# Patient Record
Sex: Female | Born: 1960
Health system: Southern US, Community
[De-identification: ages and names within clinical notes are randomized; demographics above are authoritative.]

## PROBLEM LIST (undated history)

## (undated) DIAGNOSIS — F329 Major depressive disorder, single episode, unspecified: Secondary | ICD-10-CM

## (undated) DIAGNOSIS — T4145XA Adverse effect of unspecified anesthetic, initial encounter: Secondary | ICD-10-CM

## (undated) DIAGNOSIS — K635 Polyp of colon: Secondary | ICD-10-CM

## (undated) DIAGNOSIS — M792 Neuralgia and neuritis, unspecified: Secondary | ICD-10-CM

## (undated) DIAGNOSIS — C801 Malignant (primary) neoplasm, unspecified: Secondary | ICD-10-CM

## (undated) DIAGNOSIS — R413 Other amnesia: Secondary | ICD-10-CM

## (undated) DIAGNOSIS — G47419 Narcolepsy without cataplexy: Secondary | ICD-10-CM

## (undated) DIAGNOSIS — E785 Hyperlipidemia, unspecified: Secondary | ICD-10-CM

## (undated) DIAGNOSIS — M25519 Pain in unspecified shoulder: Secondary | ICD-10-CM

## (undated) DIAGNOSIS — R0600 Dyspnea, unspecified: Secondary | ICD-10-CM

## (undated) DIAGNOSIS — G3184 Mild cognitive impairment, so stated: Principal | ICD-10-CM

## (undated) DIAGNOSIS — T8859XA Other complications of anesthesia, initial encounter: Secondary | ICD-10-CM

## (undated) DIAGNOSIS — G92 Toxic encephalopathy: Secondary | ICD-10-CM

## (undated) DIAGNOSIS — M1990 Unspecified osteoarthritis, unspecified site: Secondary | ICD-10-CM

## (undated) DIAGNOSIS — F411 Generalized anxiety disorder: Secondary | ICD-10-CM

## (undated) DIAGNOSIS — G929 Unspecified toxic encephalopathy: Secondary | ICD-10-CM

## (undated) DIAGNOSIS — K859 Acute pancreatitis without necrosis or infection, unspecified: Secondary | ICD-10-CM

## (undated) DIAGNOSIS — Q453 Other congenital malformations of pancreas and pancreatic duct: Secondary | ICD-10-CM

## (undated) DIAGNOSIS — G8929 Other chronic pain: Secondary | ICD-10-CM

## (undated) DIAGNOSIS — K219 Gastro-esophageal reflux disease without esophagitis: Secondary | ICD-10-CM

## (undated) HISTORY — DX: Malignant (primary) neoplasm, unspecified: C80.1

## (undated) HISTORY — DX: Hyperlipidemia, unspecified: E78.5

## (undated) HISTORY — PX: LUNG CANCER SURGERY: SHX702

## (undated) HISTORY — PX: UPPER ESOPHAGEAL ENDOSCOPIC ULTRASOUND (EUS): SHX6562

## (undated) HISTORY — PX: LUNG REMOVAL, PARTIAL: SHX233

## (undated) HISTORY — PX: COLONOSCOPY: SHX174

## (undated) HISTORY — PX: CHOLECYSTECTOMY: SHX55

## (undated) HISTORY — DX: Mild cognitive impairment, so stated: G31.84

## (undated) HISTORY — PX: TUBAL LIGATION: SHX77

## (undated) HISTORY — DX: Major depressive disorder, single episode, unspecified: F32.9

## (undated) HISTORY — PX: TONSILLECTOMY: SUR1361

## (undated) HISTORY — PX: OTHER SURGICAL HISTORY: SHX169

## (undated) HISTORY — DX: Other chronic pain: G89.29

## (undated) HISTORY — DX: Pain in unspecified shoulder: M25.519

## (undated) HISTORY — DX: Generalized anxiety disorder: F41.1

---

## 2010-06-17 ENCOUNTER — Other Ambulatory Visit: Payer: Self-pay

## 2010-06-17 DIAGNOSIS — C3490 Malignant neoplasm of unspecified part of unspecified bronchus or lung: Secondary | ICD-10-CM

## 2010-07-30 ENCOUNTER — Ambulatory Visit
Admission: RE | Admit: 2010-07-30 | Discharge: 2010-07-30 | Disposition: A | Discharge status: Home / Self Care | Payer: BC Managed Care – PPO | Source: Ambulatory Visit

## 2010-07-30 ENCOUNTER — Other Ambulatory Visit: Payer: Self-pay | Admitting: Medical Oncology

## 2010-07-30 DIAGNOSIS — C3490 Malignant neoplasm of unspecified part of unspecified bronchus or lung: Secondary | ICD-10-CM

## 2010-07-30 MED ORDER — IOHEXOL 300 MG/ML  SOLN
75.0000 mL | Freq: Once | INTRAMUSCULAR | Status: AC | PRN
  Administered 2010-07-30: 75 mL via INTRAVENOUS

## 2011-01-27 ENCOUNTER — Other Ambulatory Visit: Payer: Self-pay | Admitting: Medical Oncology

## 2011-01-27 DIAGNOSIS — C3410 Malignant neoplasm of upper lobe, unspecified bronchus or lung: Secondary | ICD-10-CM

## 2011-02-03 ENCOUNTER — Ambulatory Visit
Admission: RE | Admit: 2011-02-03 | Discharge: 2011-02-03 | Disposition: A | Discharge status: Home / Self Care | Payer: BC Managed Care – PPO | Source: Ambulatory Visit | Attending: Medical Oncology | Admitting: Medical Oncology

## 2011-02-03 DIAGNOSIS — C3410 Malignant neoplasm of upper lobe, unspecified bronchus or lung: Secondary | ICD-10-CM

## 2011-02-03 MED ORDER — IOHEXOL 300 MG/ML  SOLN
75.0000 mL | Freq: Once | INTRAMUSCULAR | Status: AC | PRN
  Administered 2011-02-03: 75 mL via INTRAVENOUS

## 2011-07-29 ENCOUNTER — Other Ambulatory Visit: Payer: Self-pay | Admitting: Medical Oncology

## 2011-07-29 DIAGNOSIS — C3490 Malignant neoplasm of unspecified part of unspecified bronchus or lung: Secondary | ICD-10-CM

## 2011-08-03 ENCOUNTER — Ambulatory Visit
Admission: RE | Admit: 2011-08-03 | Discharge: 2011-08-03 | Disposition: A | Discharge status: Home / Self Care | Payer: BC Managed Care – PPO | Source: Ambulatory Visit | Attending: Medical Oncology | Admitting: Medical Oncology

## 2011-08-03 DIAGNOSIS — C3490 Malignant neoplasm of unspecified part of unspecified bronchus or lung: Secondary | ICD-10-CM

## 2011-08-03 MED ORDER — IOHEXOL 300 MG/ML  SOLN
75.0000 mL | Freq: Once | INTRAMUSCULAR | Status: AC | PRN
  Administered 2011-08-03: 75 mL via INTRAVENOUS

## 2012-07-26 ENCOUNTER — Other Ambulatory Visit: Payer: Self-pay | Admitting: Medical Oncology

## 2012-07-26 DIAGNOSIS — Z08 Encounter for follow-up examination after completed treatment for malignant neoplasm: Secondary | ICD-10-CM

## 2012-08-04 ENCOUNTER — Ambulatory Visit (INDEPENDENT_AMBULATORY_CARE_PROVIDER_SITE_OTHER): Payer: BC Managed Care – PPO

## 2012-08-04 DIAGNOSIS — C3490 Malignant neoplasm of unspecified part of unspecified bronchus or lung: Secondary | ICD-10-CM

## 2012-08-04 DIAGNOSIS — Z08 Encounter for follow-up examination after completed treatment for malignant neoplasm: Secondary | ICD-10-CM

## 2012-08-04 DIAGNOSIS — Z09 Encounter for follow-up examination after completed treatment for conditions other than malignant neoplasm: Secondary | ICD-10-CM

## 2012-08-04 DIAGNOSIS — J438 Other emphysema: Secondary | ICD-10-CM

## 2012-08-04 MED ORDER — IOHEXOL 300 MG/ML  SOLN
75.0000 mL | Freq: Once | INTRAMUSCULAR | Status: AC | PRN
  Administered 2012-08-04: 75 mL via INTRAVENOUS

## 2013-02-12 DIAGNOSIS — C3410 Malignant neoplasm of upper lobe, unspecified bronchus or lung: Secondary | ICD-10-CM | POA: Insufficient documentation

## 2013-07-25 ENCOUNTER — Encounter: Payer: Self-pay | Admitting: Family Medicine

## 2013-07-25 ENCOUNTER — Ambulatory Visit (INDEPENDENT_AMBULATORY_CARE_PROVIDER_SITE_OTHER): Payer: BC Managed Care – PPO | Admitting: Family Medicine

## 2013-07-25 VITALS — BP 141/94 | HR 113 | Ht 64.0 in | Wt 184.0 lb

## 2013-07-25 DIAGNOSIS — M25519 Pain in unspecified shoulder: Secondary | ICD-10-CM

## 2013-07-25 DIAGNOSIS — G8929 Other chronic pain: Secondary | ICD-10-CM

## 2013-07-25 DIAGNOSIS — F3289 Other specified depressive episodes: Secondary | ICD-10-CM

## 2013-07-25 DIAGNOSIS — I10 Essential (primary) hypertension: Secondary | ICD-10-CM

## 2013-07-25 DIAGNOSIS — E785 Hyperlipidemia, unspecified: Secondary | ICD-10-CM

## 2013-07-25 DIAGNOSIS — K219 Gastro-esophageal reflux disease without esophagitis: Secondary | ICD-10-CM

## 2013-07-25 DIAGNOSIS — Z79899 Other long term (current) drug therapy: Secondary | ICD-10-CM

## 2013-07-25 DIAGNOSIS — F32A Depression, unspecified: Secondary | ICD-10-CM

## 2013-07-25 DIAGNOSIS — G3184 Mild cognitive impairment, so stated: Secondary | ICD-10-CM

## 2013-07-25 DIAGNOSIS — F329 Major depressive disorder, single episode, unspecified: Secondary | ICD-10-CM

## 2013-07-25 DIAGNOSIS — Z5181 Encounter for therapeutic drug level monitoring: Secondary | ICD-10-CM

## 2013-07-25 DIAGNOSIS — F411 Generalized anxiety disorder: Secondary | ICD-10-CM

## 2013-07-25 HISTORY — DX: Hyperlipidemia, unspecified: E78.5

## 2013-07-25 HISTORY — DX: Depression, unspecified: F32.A

## 2013-07-25 HISTORY — DX: Mild cognitive impairment of uncertain or unknown etiology: G31.84

## 2013-07-25 HISTORY — DX: Other chronic pain: G89.29

## 2013-07-25 HISTORY — DX: Generalized anxiety disorder: F41.1

## 2013-07-25 MED ORDER — CLONAZEPAM 0.5 MG PO TABS: 0.5000 mg | ORAL_TABLET | Freq: Two times a day (BID) | ORAL | Status: DC | PRN

## 2013-07-25 MED ORDER — SIMVASTATIN 20 MG PO TABS: 20.0000 mg | ORAL_TABLET | Freq: Every day | ORAL | Status: DC

## 2013-07-25 NOTE — Progress Notes (Signed)
CC: Yvonne Gonzales is a 53 y.o. female is here for Establish Care   Subjective: HPI:  Very pleasant 53 year old here to establish care past medical history most significant for pink os tumor right long requiring pneumectomy.  History of mild cognitive impairment ever since chemotherapy for lung cancer currently managed by her neurologist with prescriptions of Ritalin.  History of depression ever since diagnosis of lung cancer which has improved since successful treatment and currently managed by her neurologist she states her satisfaction with treatment as high at this point taking Wellbutrin on a daily basis without known side effects  History of hyperlipidemia requesting refills on simvastatin today. On chart review at [facility] cholesterol has been extremely well-controlled on this regimen. She denies right upper quadrant pain myalgias nor exertional chest pain  History of GERD for an unknown length of time per her report it is currently well managed on Nexium twice a day. Denies any abdominal pain nor regurgitation or dysphagia  Reports history of chronic shoulder pain which has been present ever since pneumectomy which is currently being managed by pain management clinic every 2 months visit  Reports a history of general anxiety disorder which has been slightly improved with Wellbutrin but overall greatly improved with twice a day Klonopin as needed. She is requesting refills on this denies mental disturbance on her current psychiatric medication regimen  She tells me that she's been told she has elevated blood pressure at lots of her specialist visits she's unsure how high it has been.  No formal physical activity she is uncertain about sodium intake.  Review of Systems - General ROS: negative for - chills, fever, night sweats, weight gain or weight loss Ophthalmic ROS: negative for - decreased vision Psychological ROS: negative for -uncontrolled anxiety or depression ENT ROS: negative for -  hearing change, nasal congestion, tinnitus or allergies Hematological and Lymphatic ROS: negative for - bleeding problems, bruising or swollen lymph nodes Breast ROS: negative Respiratory ROS: no cough, shortness of breath, or wheezing Cardiovascular ROS: no chest pain or dyspnea on exertion Gastrointestinal ROS: no abdominal pain, change in bowel habits, or black or bloody stools Genito-Urinary ROS: negative for - genital discharge, genital ulcers, incontinence or abnormal bleeding from genitals Musculoskeletal ROS: negative for - joint pain or muscle pain Neurological ROS: negative for - headaches or memory loss Dermatological ROS: negative for lumps, mole changes, rash and skin lesion changes  Past Medical History  Diagnosis Date  . Depression 07/25/2013  . Generalized anxiety disorder 07/25/2013  . Hyperlipidemia 07/25/2013  . Chronic shoulder pain 07/25/2013    [facility] since cancer treatment.   . Mild cognitive impairment 07/25/2013    [facility] - thought to be secondary to cancer treatment     Past Surgical History  Procedure Laterality Date  . Lung removal, partial Right   . Lung cancer surgery Right     pancost tumor   No family history on file.  History   Social History  . Marital Status: Married    Spouse Name: N/A    Number of Children: N/A  . Years of Education: N/A   Occupational History  . Not on file.   Social History Main Topics  . Smoking status: Not on file  . Smokeless tobacco: Not on file  . Alcohol Use: Not on file  . Drug Use: Not on file  . Sexual Activity: Not on file   Other Topics Concern  . Not on file  Social History Narrative  . No narrative on file     Objective: BP 141/94  Pulse 113  Ht 5\' 4"  (1.626 m)  Wt 184 lb (83.462 kg)  BMI 31.57 kg/m2  General: Alert and Oriented, No Acute Distress HEENT: Pupils equal, round, reactive to light. Conjunctivae clear.  Moist mucous membranes pharynx  unremarkable Lungs: Clear to auscultation bilaterally, no wheezing/ronchi/rales.  Comfortable work of breathing. Good air movement. Cardiac: Regular rate and rhythm. Normal S1/S2.  No murmurs, rubs, nor gallops.   Abdomen: Mild obesity soft and nontender Extremities: No peripheral edema.  Strong peripheral pulses.  Mental Status: No depression, anxiety, nor agitation. Skin: Warm and dry.  Assessment & Plan: [name] was seen today for establish care.  Diagnoses and associated orders for this visit:  Mild cognitive impairment  Depression  Hyperlipidemia - Lipid Profile - COMPLETE METABOLIC PANEL WITH GFR - simvastatin (ZOCOR) 20 MG tablet; Take 1 tablet (20 mg total) by mouth daily.  GERD (gastroesophageal reflux disease)  Chronic shoulder pain  Generalized anxiety disorder - clonazePAM (KLONOPIN) 0.5 MG tablet; Take 1 tablet (0.5 mg total) by mouth 2 (two) times daily as needed for anxiety.  Encounter for monitoring statin therapy - COMPLETE METABOLIC PANEL WITH GFR  Essential hypertension, benign    Mild cognitive impairment: Currently managed by neurology Depression: Controlled continue Wellbutrin I be more than happy to manage this in the future Hyperlipidemia: Due for annual lipid panel and liver enzymes GERD: Controlled continue Nexium Chronic shoulder pain: Managed by pain management Generalized anxiety disorder: Controlled her report a prescription habits are confirmed on [place] controlled substance database, refill provided today Essential hypertension: New diagnosis, Discussed diet and exercise interventions with emphasis on sodium restriction to help keep blood pressure down we will recheck this in 1-2 months after interventions are in place to see if she needs antihypertensives    Return in about 4 weeks (around 08/22/2013) for BP FU.

## 2013-07-25 NOTE — Patient Instructions (Signed)
DASH Diet  The DASH diet stands for "Dietary Approaches to Stop Hypertension." It is a healthy eating plan that has been shown to reduce high blood pressure (hypertension) in as little as 14 days, while also possibly providing other significant health benefits. These other health benefits include reducing the risk of breast cancer after menopause and reducing the risk of type 2 diabetes, heart disease, colon cancer, and stroke. Health benefits also include weight loss and slowing kidney failure in patients with chronic kidney disease.   DIET GUIDELINES  · Limit salt (sodium). Your diet should contain less than 1500 mg of sodium daily.  · Limit refined or processed carbohydrates. Your diet should include mostly whole grains. Desserts and added sugars should be used sparingly.  · Include small amounts of heart-healthy fats. These types of fats include nuts, oils, and tub margarine. Limit saturated and trans fats. These fats have been shown to be harmful in the body.  CHOOSING FOODS   The following food groups are based on a 2000 calorie diet. See your Registered Dietitian for individual calorie needs.  Grains and Grain Products (6 to 8 servings daily)  · Eat More Often: Whole-wheat bread, brown rice, whole-grain or wheat pasta, quinoa, popcorn without added fat or salt (air popped).  · Eat Less Often: White bread, white pasta, white rice, cornbread.  Vegetables (4 to 5 servings daily)  · Eat More Often: Fresh, frozen, and canned vegetables. Vegetables may be raw, steamed, roasted, or grilled with a minimal amount of fat.  · Eat Less Often/Avoid: Creamed or fried vegetables. Vegetables in a cheese sauce.  Fruit (4 to 5 servings daily)  · Eat More Often: All fresh, canned (in natural juice), or frozen fruits. Dried fruits without added sugar. One hundred percent fruit juice (½ cup [237 mL] daily).  · Eat Less Often: Dried fruits with added sugar. Canned fruit in light or heavy syrup.  Lean Meats, Fish, and Poultry (2  servings or less daily. One serving is 3 to 4 oz [85-114 g]).  · Eat More Often: Ninety percent or leaner ground beef, tenderloin, sirloin. Round cuts of beef, chicken breast, turkey breast. All fish. Grill, bake, or broil your meat. Nothing should be fried.  · Eat Less Often/Avoid: Fatty cuts of meat, turkey, or chicken leg, thigh, or wing. Fried cuts of meat or fish.  Dairy (2 to 3 servings)  · Eat More Often: Low-fat or fat-free milk, low-fat plain or light yogurt, reduced-fat or part-skim cheese.  · Eat Less Often/Avoid: Milk (whole, 2%). Whole milk yogurt. Full-fat cheeses.  Nuts, Seeds, and Legumes (4 to 5 servings per week)  · Eat More Often: All without added salt.  · Eat Less Often/Avoid: Salted nuts and seeds, canned beans with added salt.  Fats and Sweets (limited)  · Eat More Often: Vegetable oils, tub margarines without trans fats, sugar-free gelatin. Mayonnaise and salad dressings.  · Eat Less Often/Avoid: Coconut oils, palm oils, butter, stick margarine, cream, half and half, cookies, candy, pie.  FOR MORE INFORMATION  The Dash Diet Eating Plan: www.dashdiet.org  Document Released: 04/29/2011 Document Revised: 08/02/2011 Document Reviewed: 04/29/2011  ExitCare® Patient Information ©2014 [employer].

## 2013-09-24 ENCOUNTER — Ambulatory Visit: Payer: BC Managed Care – PPO | Admitting: Family Medicine

## 2013-10-03 ENCOUNTER — Other Ambulatory Visit: Payer: Self-pay | Admitting: Medical Oncology

## 2013-10-03 DIAGNOSIS — Z1231 Encounter for screening mammogram for malignant neoplasm of breast: Secondary | ICD-10-CM

## 2013-10-04 ENCOUNTER — Ambulatory Visit (INDEPENDENT_AMBULATORY_CARE_PROVIDER_SITE_OTHER): Payer: BC Managed Care – PPO

## 2013-10-04 DIAGNOSIS — Z1231 Encounter for screening mammogram for malignant neoplasm of breast: Secondary | ICD-10-CM

## 2013-10-04 LAB — LIPID PANEL
CHOL/HDL RATIO: 3.2 ratio
CHOLESTEROL: 133 mg/dL (ref 0–200)
HDL: 42 mg/dL (ref >39)
LDL Cholesterol: 60 mg/dL (ref 0–99)
Triglycerides: 155 mg/dL — ABNORMAL HIGH (ref <150)
VLDL: 31 mg/dL (ref 0–40)

## 2013-10-04 LAB — COMPLETE METABOLIC PANEL WITH GFR
ALBUMIN: 4.2 g/dL (ref 3.5–5.2)
ALK PHOS: 79 U/L (ref 39–117)
ALT: 11 U/L (ref 0–35)
AST: 14 U/L (ref 0–37)
BUN: 8 mg/dL (ref 6–23)
CALCIUM: 9.2 mg/dL (ref 8.4–10.5)
CHLORIDE: 104 meq/L (ref 96–112)
CO2: 29 mEq/L (ref 19–32)
Creat: 0.76 mg/dL (ref 0.50–1.10)
GFR, Est African American: >89 mL/min
GLUCOSE: 93 mg/dL (ref 70–99)
POTASSIUM: 4.1 meq/L (ref 3.5–5.3)
SODIUM: 141 meq/L (ref 135–145)
TOTAL PROTEIN: 7.2 g/dL (ref 6.0–8.3)
Total Bilirubin: 0.5 mg/dL (ref 0.2–1.2)

## 2013-10-05 ENCOUNTER — Encounter: Payer: Self-pay | Admitting: Family Medicine

## 2013-10-05 ENCOUNTER — Ambulatory Visit (INDEPENDENT_AMBULATORY_CARE_PROVIDER_SITE_OTHER): Payer: BC Managed Care – PPO | Admitting: Family Medicine

## 2013-10-05 VITALS — BP 118/75 | HR 75 | Wt 184.0 lb

## 2013-10-05 DIAGNOSIS — F411 Generalized anxiety disorder: Secondary | ICD-10-CM

## 2013-10-05 DIAGNOSIS — I10 Essential (primary) hypertension: Secondary | ICD-10-CM

## 2013-10-05 DIAGNOSIS — G929 Unspecified toxic encephalopathy: Secondary | ICD-10-CM

## 2013-10-05 DIAGNOSIS — F32A Depression, unspecified: Secondary | ICD-10-CM

## 2013-10-05 DIAGNOSIS — F3289 Other specified depressive episodes: Secondary | ICD-10-CM

## 2013-10-05 DIAGNOSIS — F329 Major depressive disorder, single episode, unspecified: Secondary | ICD-10-CM

## 2013-10-05 DIAGNOSIS — G92 Toxic encephalopathy: Secondary | ICD-10-CM

## 2013-10-05 MED ORDER — CLONAZEPAM 0.5 MG PO TABS: 0.5000 mg | ORAL_TABLET | Freq: Two times a day (BID) | ORAL | Status: DC | PRN

## 2013-10-05 NOTE — Progress Notes (Signed)
CC: Yvonne Gonzales is a 53 y.o. female is here for f/u BP and discuss labs   Subjective: HPI:  Followup mild cognitive impairment: Her neurologist has switched her from Ritalin to Adderall last week due to abdominal pain which has resolved since this switch.  Her neurologist has formally change diagnosis to toxic encephalopathy for disability purposes. Symptoms have been present and persistent of mild to moderate severity ever since chemotherapy for lung cancer. There has been no disorientation, nor worsening of short-term or long-term memory since I saw her last  Essential hypertension: No outside blood pressures to report. She was encouraged to reduce sodium in her diet at the last visit she's unsure whether or not she's been able to do this. No physical exercise routine. Denies chest pain, shortness of breath, wheezing, motor sensory disturbances, orthopnea nor peripheral edema  Generalized anxiety disorder: Continues to take Klonopin twice a day as needed for anxiety. Symptoms are worsened if she takes the wrong exit/street or if she is over burdened with information. States that overall symptoms are manageable and not interfering with quality of life. She denies any other mental disturbance  Depression: Continues on Wellbutrin without any decline and subjective depression nor known side effects   Review Of Systems Outlined In HPI  Past Medical History  Diagnosis Date  . Depression 07/25/2013  . Generalized anxiety disorder 07/25/2013  . Hyperlipidemia 07/25/2013  . Chronic shoulder pain 07/25/2013    [facility] since cancer treatment.   . Mild cognitive impairment 07/25/2013    [facility] - thought to be secondary to cancer treatment     Past Surgical History  Procedure Laterality Date  . Lung removal, partial Right   . Lung cancer surgery Right     pancost tumor   No family history on file.  History   Social History  . Marital Status: Married   Spouse Name: N/A    Number of Children: N/A  . Years of Education: N/A   Occupational History  . Not on file.   Social History Main Topics  . Smoking status: Never Smoker   . Smokeless tobacco: Not on file  . Alcohol Use: Not on file  . Drug Use: Not on file  . Sexual Activity: Not on file   Other Topics Concern  . Not on file   Social History Narrative  . No narrative on file     Objective: BP 118/75  Pulse 75  Wt 184 lb (83.462 kg)  General: Alert and Oriented, No Acute Distress HEENT: Pupils equal, round, reactive to light. Conjunctivae clear.  Moist mucous membranes there is unremarkable Lungs: Clear to auscultation bilaterally, no wheezing/ronchi/rales.  Comfortable work of breathing. Good air movement. Cardiac: Regular rate and rhythm. Normal S1/S2.  No murmurs, rubs, nor gallops.   Extremities: No peripheral edema.  Strong peripheral pulses.  Mental Status: No depression, anxiety, nor agitation. Skin: Warm and dry.  Assessment & Plan: Tayllor was seen today for f/u bp and discuss labs.  Diagnoses and associated orders for this visit:  Essential hypertension, benign  Toxic encephalopathy  Generalized anxiety disorder - clonazePAM (KLONOPIN) 0.5 MG tablet; Take 1 tablet (0.5 mg total) by mouth 2 (two) times daily as needed for anxiety.  Depression    Essential hypertension: Improved continue diet and exercise interventions. Manage without medications at this time Generalized anxiety disorder: Continue as needed Klonopin Toxic encephalopathy: Stable continue Adderall prescribed by neurologist Depression: Controlled continue Wellbutrin  Return in about  6 months (around 04/07/2014).

## 2013-10-18 ENCOUNTER — Other Ambulatory Visit: Payer: Self-pay | Admitting: Medical Oncology

## 2013-10-22 ENCOUNTER — Other Ambulatory Visit: Payer: Self-pay | Admitting: Medical Oncology

## 2013-10-22 DIAGNOSIS — C3490 Malignant neoplasm of unspecified part of unspecified bronchus or lung: Secondary | ICD-10-CM

## 2013-10-23 ENCOUNTER — Ambulatory Visit (INDEPENDENT_AMBULATORY_CARE_PROVIDER_SITE_OTHER): Payer: BC Managed Care – PPO

## 2013-10-23 DIAGNOSIS — J438 Other emphysema: Secondary | ICD-10-CM

## 2013-10-23 DIAGNOSIS — C3490 Malignant neoplasm of unspecified part of unspecified bronchus or lung: Secondary | ICD-10-CM

## 2013-10-23 DIAGNOSIS — Z9889 Other specified postprocedural states: Secondary | ICD-10-CM

## 2013-10-23 DIAGNOSIS — Z85118 Personal history of other malignant neoplasm of bronchus and lung: Secondary | ICD-10-CM

## 2013-10-23 MED ORDER — IOHEXOL 300 MG/ML  SOLN
80.0000 mL | Freq: Once | INTRAMUSCULAR | Status: AC | PRN
  Administered 2013-10-23: 80 mL via INTRAVENOUS

## 2014-01-04 ENCOUNTER — Telehealth: Payer: Self-pay | Admitting: Family Medicine

## 2014-01-04 DIAGNOSIS — F411 Generalized anxiety disorder: Secondary | ICD-10-CM

## 2014-01-04 MED ORDER — CLONAZEPAM 0.5 MG PO TABS: 0.5000 mg | ORAL_TABLET | Freq: Two times a day (BID) | ORAL | Status: DC | PRN

## 2014-01-04 NOTE — Telephone Encounter (Signed)
Refill req 

## 2014-04-03 ENCOUNTER — Encounter: Payer: Self-pay | Admitting: Family Medicine

## 2014-04-03 DIAGNOSIS — G47419 Narcolepsy without cataplexy: Secondary | ICD-10-CM | POA: Insufficient documentation

## 2014-04-08 ENCOUNTER — Encounter: Payer: Self-pay | Admitting: Family Medicine

## 2014-04-08 ENCOUNTER — Ambulatory Visit (INDEPENDENT_AMBULATORY_CARE_PROVIDER_SITE_OTHER): Payer: BC Managed Care – PPO | Admitting: Family Medicine

## 2014-04-08 VITALS — BP 124/73 | HR 112 | Wt 182.0 lb

## 2014-04-08 DIAGNOSIS — E785 Hyperlipidemia, unspecified: Secondary | ICD-10-CM | POA: Diagnosis not present

## 2014-04-08 DIAGNOSIS — I10 Essential (primary) hypertension: Secondary | ICD-10-CM

## 2014-04-08 DIAGNOSIS — F411 Generalized anxiety disorder: Secondary | ICD-10-CM

## 2014-04-08 MED ORDER — CLONAZEPAM 0.5 MG PO TABS: 0.5000 mg | ORAL_TABLET | Freq: Two times a day (BID) | ORAL | Status: DC | PRN

## 2014-04-08 NOTE — Progress Notes (Signed)
CC: Yvonne Gonzales is a 53 y.o. female is here for Hypertension and Hyperlipidemia   Subjective: HPI:  Follow-up essential hypertension: She tells me that she was told that her blood pressure was elevated at her pain management clinic last month. When she saw her neurologist earlier this month pressures were normotensive. No other outside blood pressures to report. Denies chest pain shortness of breath orthopnea nor peripheral edema  Follow-up hyperlipidemia: She stopped taking simvastatin 2-3 months ago because she wasn't sure if she really needs to be on it. She denies any intolerance or side effects. She feels no different now than when she was on the medication. She denies any myalgias or right upper quadrant pain  Follow-up anxiety: She is no longer taking Wellbutrin. She takes Klonopin 1-2 times a day when needed for anxiety. She tells me provides complete relief of any situational anxiety or irritability. She denies any other mental disturbance currently.   Review Of Systems Outlined In HPI  Past Medical History  Diagnosis Date  . Depression 07/25/2013  . Generalized anxiety disorder 07/25/2013  . Hyperlipidemia 07/25/2013  . Chronic shoulder pain 07/25/2013    [facility] since cancer treatment.   . Mild cognitive impairment 07/25/2013    [facility] - thought to be secondary to cancer treatment   . Cancer Lung (right side)    Past Surgical History  Procedure Laterality Date  . Lung removal, partial Right   . Lung cancer surgery Right     pancost tumor   No family history on file.  History   Social History  . Marital Status: Married    Spouse Name: N/A    Number of Children: N/A  . Years of Education: N/A   Occupational History  . Not on file.   Social History Main Topics  . Smoking status: Never Smoker   . Smokeless tobacco: Not on file  . Alcohol Use: Not on file  . Drug Use: Not on file  . Sexual Activity: Not on file   Other Topics  Concern  . Not on file   Social History Narrative     Objective: BP 124/73 mmHg  Pulse 112  Wt 182 lb (82.555 kg)  General: Alert and Oriented, No Acute Distress HEENT: Pupils equal, round, reactive to light. Conjunctivae clear.   Lungs: Clear to auscultation bilaterally, no wheezing/ronchi/rales.  Comfortable work of breathing. Good air movement. Cardiac: Regular rate and rhythm. Normal S1/S2.  No murmurs, rubs, nor gallops.   Extremities: No peripheral edema.  Strong peripheral pulses.  Mental Status: No depression, anxiety, nor agitation. Skin: Warm and dry.  Assessment & Plan: Yvonne Gonzales was seen today for hypertension and hyperlipidemia.  Diagnoses and associated orders for this visit:  Essential hypertension, benign  Hyperlipidemia - Lipid panel  Generalized anxiety disorder - clonazePAM (KLONOPIN) 0.5 MG tablet; Take 1 tablet (0.5 mg total) by mouth 2 (two) times daily as needed for anxiety.    Essential hypertension: Controlled with diet, salt restriction, no current indication for antihypertensives Hyperlipidemia: Checking lipid panel today to calculate 10 year AHA risk, discussed that if she is not 7.5% or above there is no indication to restart simvastatin. Generalized anxiety disorder: Controlled continue clonazepam  Return in about 3 months (around 07/09/2014).

## 2014-04-16 LAB — LIPID PANEL
CHOL/HDL RATIO: 5.2 ratio
CHOLESTEROL: 182 mg/dL (ref 0–200)
HDL: 35 mg/dL — ABNORMAL LOW (ref >39)
LDL Cholesterol: 81 mg/dL (ref 0–99)
Triglycerides: 331 mg/dL — ABNORMAL HIGH (ref <150)
VLDL: 66 mg/dL — AB (ref 0–40)

## 2014-04-17 ENCOUNTER — Other Ambulatory Visit: Payer: Self-pay | Admitting: Family Medicine

## 2014-04-17 DIAGNOSIS — E781 Pure hyperglyceridemia: Secondary | ICD-10-CM

## 2014-04-25 ENCOUNTER — Ambulatory Visit (INDEPENDENT_AMBULATORY_CARE_PROVIDER_SITE_OTHER): Payer: BC Managed Care – PPO | Admitting: Family Medicine

## 2014-04-25 ENCOUNTER — Encounter: Payer: Self-pay | Admitting: Family Medicine

## 2014-04-25 VITALS — BP 107/76 | HR 108 | Ht 64.0 in | Wt 184.0 lb

## 2014-04-25 DIAGNOSIS — R8290 Unspecified abnormal findings in urine: Secondary | ICD-10-CM

## 2014-04-25 DIAGNOSIS — N644 Mastodynia: Secondary | ICD-10-CM | POA: Diagnosis not present

## 2014-04-25 LAB — POCT URINALYSIS DIPSTICK
Bilirubin, UA: NEGATIVE
GLUCOSE UA: NEGATIVE
Ketones, UA: NEGATIVE
Nitrite, UA: NEGATIVE
Spec Grav, UA: 1.025
UROBILINOGEN UA: 0.2
pH, UA: 5

## 2014-04-25 MED ORDER — CIPROFLOXACIN HCL 250 MG PO TABS: ORAL_TABLET | ORAL | Status: DC

## 2014-04-25 NOTE — Progress Notes (Signed)
CC: Yvonne Gonzales is a 53 y.o. female is here for Breast Pain and abnormal urine odor   Subjective: HPI:  Complains of new foul odor to her urine Present for the last 4 days moderate in severity nothing particularly makes it better or worse. No interventions as of yet. Symptoms are present all hours of the day she is not sure whether or not it's getting better since onset. Denies dysuria or urinary frequency or hesitancy. Review of systems positive for loose stools. Denies fevers, chills, has had no flank pain.  Combines of bilateral breast tenderness described as a sensation that she is about to have her menstrual cycle despite not having any vaginal bleeding for over 10 years now. Symptoms have been present for 4 days at the same time her urinary complaints began. No new medications nor supplements. Denies any architectural changes or focal breast pain. Denies any known skin changes to the breasts since symptoms began. She tells me her last mammogram was in the summer of 2015 and showed no abnormality. She denies any nipple complaints   Review Of Systems Outlined In HPI  Past Medical History  Diagnosis Date  . Depression 07/25/2013  . Generalized anxiety disorder 07/25/2013  . Hyperlipidemia 07/25/2013  . Chronic shoulder pain 07/25/2013    [facility] since cancer treatment.   . Mild cognitive impairment 07/25/2013    [facility] - thought to be secondary to cancer treatment   . Cancer Lung (right side)    Past Surgical History  Procedure Laterality Date  . Lung removal, partial Right   . Lung cancer surgery Right     pancost tumor   No family history on file.  History   Social History  . Marital Status: Married    Spouse Name: N/A    Number of Children: N/A  . Years of Education: N/A   Occupational History  . Not on file.   Social History Main Topics  . Smoking status: Never Smoker   . Smokeless tobacco: Not on file  . Alcohol Use: Not on file   . Drug Use: Not on file  . Sexual Activity: Not on file   Other Topics Concern  . Not on file   Social History Narrative     Objective: BP 107/76 mmHg  Pulse 108  Ht 5\' 4"  (1.626 m)  Wt 184 lb (83.462 kg)  BMI 31.57 kg/m2  General: Alert and Oriented, No Acute Distress HEENT: Pupils equal, round, reactive to light. Conjunctivae clear.  Moist membranes pharynx unremarkable Lungs: Clear, work of breathing Cardiac: Regular rate and rhythm.  Breast: With Amber as chaperone there are no palpable axillary or supraclavicular nodules or abnormalities. While sitting there are no gross breast architectural abnormalities in both nipples appear unremarkable without retraction. Skin is unremarkable overlying both breasts. No lesions underneath the breasts. No palpable masses in either breast nor inducible pain/discomfort while palpating. No nipple discharge Extremities: No peripheral edema.  Strong peripheral pulses.  Mental Status: No depression, anxiety, nor agitation. Skin: Warm and dry.  Assessment & Plan: Kindred was seen today for breast pain and abnormal urine odor.  Diagnoses and associated orders for this visit:  Abnormal urine odor - POCT urinalysis dipstick - Urine culture  Breast pain - Prolactin - TSH  Other Orders - ciprofloxacin (CIPRO) 250 MG tablet; Take one by mouth twice a day for five days.    Abnormal urine order: Blood and leukocytes in urine raise the suspicion for UTI  therefore start Cipro pending urine culture Breast pain: Checking prolactin and TSH, low suspicion for oncologic process at this time   Return if symptoms worsen or fail to improve.

## 2014-04-27 LAB — URINE CULTURE
COLONY COUNT: NO GROWTH
ORGANISM ID, BACTERIA: NO GROWTH

## 2014-04-27 LAB — PROLACTIN: Prolactin: 18.3 ng/mL

## 2014-04-27 LAB — TSH: TSH: 2.85 u[IU]/mL (ref 0.350–4.500)

## 2014-04-29 ENCOUNTER — Telehealth: Payer: Self-pay | Admitting: *Deleted

## 2014-04-29 MED ORDER — SULFAMETHOXAZOLE-TRIMETHOPRIM 800-160 MG PO TABS: ORAL_TABLET | ORAL | Status: AC

## 2014-04-29 NOTE — Telephone Encounter (Signed)
Trimethoprim-sulfa has been sent to [facility] as a substitution

## 2014-04-29 NOTE — Telephone Encounter (Signed)
Yvonne Gonzales had an allergic reaction to the cipro you prescribed last week. Can you please send another antibiotic in for her. Please advise. Margette Fast, CMA

## 2014-04-30 NOTE — Telephone Encounter (Signed)
Voicemail left for patient to pick up antibiotic at pharmacy. Margette Fast, CMA

## 2014-05-06 ENCOUNTER — Telehealth: Payer: Self-pay

## 2014-05-06 DIAGNOSIS — N950 Postmenopausal bleeding: Secondary | ICD-10-CM

## 2014-05-06 DIAGNOSIS — N644 Mastodynia: Secondary | ICD-10-CM

## 2014-05-06 NOTE — Telephone Encounter (Signed)
Yvonne Gonzales states the bilateral breast tenderness has not resolved and now she has vaginal spotting for 4 days, postmenopausal, menopause started after chemo. Referral sent to [facility].

## 2014-05-07 NOTE — Telephone Encounter (Signed)
agreed

## 2014-05-13 ENCOUNTER — Encounter: Payer: BC Managed Care – PPO | Admitting: Obstetrics & Gynecology

## 2014-05-15 ENCOUNTER — Encounter: Payer: Self-pay | Admitting: Obstetrics & Gynecology

## 2014-05-15 ENCOUNTER — Ambulatory Visit (INDEPENDENT_AMBULATORY_CARE_PROVIDER_SITE_OTHER): Payer: BC Managed Care – PPO | Admitting: Obstetrics & Gynecology

## 2014-05-15 VITALS — BP 119/82 | HR 76 | Resp 16 | Ht 64.0 in | Wt 184.0 lb

## 2014-05-15 DIAGNOSIS — N950 Postmenopausal bleeding: Secondary | ICD-10-CM | POA: Diagnosis not present

## 2014-05-15 DIAGNOSIS — Z124 Encounter for screening for malignant neoplasm of cervix: Secondary | ICD-10-CM

## 2014-05-15 DIAGNOSIS — Z1151 Encounter for screening for human papillomavirus (HPV): Secondary | ICD-10-CM

## 2014-05-15 DIAGNOSIS — Z0000 Encounter for general adult medical examination without abnormal findings: Secondary | ICD-10-CM

## 2014-05-15 MED ORDER — MISOPROSTOL 200 MCG PO TABS: ORAL_TABLET | ORAL | Status: DC

## 2014-05-15 NOTE — Progress Notes (Signed)
   Subjective:    Patient ID: Yvonne Gonzales, female    DOB: 09-07-1960, 53 y.o.   MRN: [id number]  HPI  53 yo MW P2 (69 and 7 yo kids, 1 grandchild) here because she saw some PMB about 10 days ago,lasted for about a week. She had chemo 6 chemotherapy for lung cancer and stopped having periods then. Her mother was in her mid 52s for menopause.  Review of Systems Last pap about 3 years ago Last mammo 5/15  Declines a flu vaccine today.    Objective:   Physical Exam        Assessment & Plan:  Preventative care- pap smear today ?PMB- check FSH and gyn u/s. If +PMB, then cytotec and EMBX soon

## 2014-05-15 NOTE — Addendum Note (Signed)
Addended by: Asencion Islam on: 05/15/2014 10:55 AM   Modules accepted: Orders

## 2014-05-16 LAB — CYTOLOGY - PAP

## 2014-05-16 LAB — FOLLICLE STIMULATING HORMONE: FSH: 76.5 m[IU]/mL

## 2014-05-21 ENCOUNTER — Telehealth: Payer: Self-pay | Admitting: *Deleted

## 2014-05-21 ENCOUNTER — Ambulatory Visit (INDEPENDENT_AMBULATORY_CARE_PROVIDER_SITE_OTHER): Payer: BC Managed Care – PPO

## 2014-05-21 DIAGNOSIS — N950 Postmenopausal bleeding: Secondary | ICD-10-CM

## 2014-05-21 DIAGNOSIS — R938 Abnormal findings on diagnostic imaging of other specified body structures: Secondary | ICD-10-CM

## 2014-05-21 NOTE — Telephone Encounter (Signed)
Pt notified of lab and U/S results.  Appt scheduled for Endo [facility] 1/6/156 with Dr Hulan Fray.

## 2014-05-29 ENCOUNTER — Other Ambulatory Visit: Payer: BC Managed Care – PPO | Admitting: Obstetrics & Gynecology

## 2014-05-29 ENCOUNTER — Ambulatory Visit (INDEPENDENT_AMBULATORY_CARE_PROVIDER_SITE_OTHER): Payer: 59 | Admitting: Obstetrics & Gynecology

## 2014-05-29 ENCOUNTER — Encounter: Payer: Self-pay | Admitting: Obstetrics & Gynecology

## 2014-05-29 VITALS — BP 124/82 | HR 101 | Resp 16 | Ht 64.0 in | Wt 181.0 lb

## 2014-05-29 DIAGNOSIS — N950 Postmenopausal bleeding: Secondary | ICD-10-CM

## 2014-05-29 NOTE — Progress Notes (Signed)
   Subjective:    Patient ID: Yvonne Gonzales, female    DOB: 1960-12-07, 54 y.o.   MRN: [id number]  HPI  This FSH-proven post menopausal lady is here for  [facility] due to PMB. She took cytotec last night and has had increased bleeding.  Review of Systems     Objective:   Physical Exam   UPT negative, consent signed, time out done Cervix prepped with betadine and grasped with a single tooth tenaculum Uterus sounded to 9 cm Pipelle used for 4 passes with a moderate amount of tissue obtained. She tolerated the procedure well.        Assessment & Plan:  PMB- await pathology

## 2014-07-01 ENCOUNTER — Ambulatory Visit: Payer: 59 | Admitting: Obstetrics & Gynecology

## 2014-07-02 ENCOUNTER — Ambulatory Visit (INDEPENDENT_AMBULATORY_CARE_PROVIDER_SITE_OTHER): Payer: 59 | Admitting: Obstetrics & Gynecology

## 2014-07-02 ENCOUNTER — Encounter: Payer: Self-pay | Admitting: Obstetrics & Gynecology

## 2014-07-02 VITALS — BP 125/81 | HR 100 | Resp 16 | Ht 64.0 in | Wt 183.0 lb

## 2014-07-02 DIAGNOSIS — R6882 Decreased libido: Secondary | ICD-10-CM

## 2014-07-02 DIAGNOSIS — N950 Postmenopausal bleeding: Secondary | ICD-10-CM

## 2014-07-02 MED ORDER — VALACYCLOVIR HCL 1 G PO TABS: 1000.0000 mg | ORAL_TABLET | Freq: Two times a day (BID) | ORAL | Status: AC

## 2014-07-02 MED ORDER — MEDROXYPROGESTERONE ACETATE 10 MG PO TABS: 10.0000 mg | ORAL_TABLET | Freq: Every day | ORAL | Status: DC

## 2014-07-02 NOTE — Progress Notes (Signed)
   Subjective:    Patient ID: Yvonne Gonzales, female    DOB: 05-10-1961, 54 y.o.   MRN: [id number]  HPI  54 yo MW lady here for 2 reasons: 1) result of her EMBX for PMB. She has had no more PMB. Her result showed atrophy. NED  Her second issue is that of decreased libido since her cancer treatment 2010. Her husband has a "too healthy" libido. She uses a lubricant with sex. She denies dyspareunia.  She cannot reach orgasm 99% of the time since her cancer treatment.  Review of Systems     Objective:   Physical Exam   WNWHWFNAD Breathing normally Neuro intact     Assessment & Plan:  PMB- negaive EMBX. Thickened endometrium on u/s (14 mm). I will prescribe cyclic provera for 3 months. If she has further PMB after 6/16, then rec d&c and hysteroscopy.  Hypoactive desire disorder-trial of Addyi 100 mg qhs  RTC 6 weeks

## 2014-08-13 ENCOUNTER — Ambulatory Visit (INDEPENDENT_AMBULATORY_CARE_PROVIDER_SITE_OTHER): Payer: 59 | Admitting: Obstetrics & Gynecology

## 2014-08-13 ENCOUNTER — Encounter: Payer: Self-pay | Admitting: Obstetrics & Gynecology

## 2014-08-13 VITALS — Ht 64.0 in | Wt 184.0 lb

## 2014-08-13 DIAGNOSIS — N950 Postmenopausal bleeding: Secondary | ICD-10-CM

## 2014-08-13 DIAGNOSIS — R6882 Decreased libido: Secondary | ICD-10-CM | POA: Diagnosis not present

## 2014-08-13 NOTE — Progress Notes (Signed)
   Subjective:    Patient ID: Yvonne Gonzales, female    DOB: 1961/03/07, 54 y.o.   MRN: [id number]  HPI  54 yo lady here today to follow up since her last visit 07/02/14. She did not take the provera (She really didn't understand that she needed to take it.)  She didn't get the Addyi due to expense (had coupon).  Review of Systems     Objective:   Physical Exam WNWHWFNAD Breathing and ambulating normally       Assessment & Plan:  PMB- PMB- negaive EMBX. Thickened endometrium on u/s (14 mm). I will prescribe cyclic provera for 3 months. If she has further PMB after 6/16, then rec d&c and hysteroscopy. I have reiterated that she should take the provera.  Decreased libido- Since she doesn't want the Addyi, I offered a trial testosterone. She would like to try it.

## 2014-09-10 ENCOUNTER — Other Ambulatory Visit: Payer: Self-pay | Admitting: Family Medicine

## 2014-09-10 MED ORDER — CLONAZEPAM 0.5 MG PO TABS: 0.5000 mg | ORAL_TABLET | Freq: Two times a day (BID) | ORAL | Status: DC | PRN

## 2014-09-10 NOTE — Telephone Encounter (Signed)
Rx faxed to [facility]. Aden Youngman,CMA

## 2014-09-10 NOTE — Telephone Encounter (Signed)
Yvonne Gonzales, Rx placed in in-box ready for pickup/faxing.  

## 2014-11-04 ENCOUNTER — Encounter: Payer: Self-pay | Admitting: Family Medicine

## 2014-11-04 ENCOUNTER — Ambulatory Visit (INDEPENDENT_AMBULATORY_CARE_PROVIDER_SITE_OTHER): Payer: 59 | Admitting: Family Medicine

## 2014-11-04 VITALS — BP 114/81 | HR 111 | Wt 182.0 lb

## 2014-11-04 DIAGNOSIS — F329 Major depressive disorder, single episode, unspecified: Secondary | ICD-10-CM | POA: Diagnosis not present

## 2014-11-04 DIAGNOSIS — T402X5A Adverse effect of other opioids, initial encounter: Secondary | ICD-10-CM

## 2014-11-04 DIAGNOSIS — F32A Depression, unspecified: Secondary | ICD-10-CM

## 2014-11-04 DIAGNOSIS — F411 Generalized anxiety disorder: Secondary | ICD-10-CM | POA: Diagnosis not present

## 2014-11-04 DIAGNOSIS — K5903 Drug induced constipation: Secondary | ICD-10-CM

## 2014-11-04 DIAGNOSIS — I10 Essential (primary) hypertension: Secondary | ICD-10-CM | POA: Diagnosis not present

## 2014-11-04 DIAGNOSIS — K5909 Other constipation: Secondary | ICD-10-CM | POA: Diagnosis not present

## 2014-11-04 MED ORDER — NALOXEGOL OXALATE 25 MG PO TABS: 25.0000 mg | ORAL_TABLET | Freq: Every day | ORAL | Status: DC

## 2014-11-04 MED ORDER — CLONAZEPAM 0.5 MG PO TABS: 0.5000 mg | ORAL_TABLET | Freq: Two times a day (BID) | ORAL | Status: DC | PRN

## 2014-11-04 MED ORDER — ESCITALOPRAM OXALATE 10 MG PO TABS: 10.0000 mg | ORAL_TABLET | Freq: Every day | ORAL | Status: DC

## 2014-11-04 NOTE — Progress Notes (Signed)
CC: Yvonne Gonzales is a 54 y.o. female is here for Anxiety   Subjective: HPI:  Follow-up anxiety: Continues to take clonazepam twice a day. She still reports that about once or twice a week she'll get a nervous sensation throughout her entire body with tunnel vision. Nothing particularly triggers this and it goes away after a few minutes. She feels intensely anxious for no particular reason when this happens.   Follow-up depression: She tells me that she's lost all motivation to do old hobbies and formerly pleasurable activities. Symptoms were annoying in the past however now significantly interfering with quality of life. She's tried Prozac and Wellbutrin both of which were either intolerable or did no help with what she thinks is depression.  One harm self or others. Denies sleep changes or appetite changes  Follow up essential hypertension: Currently on no antihypertensives. No chest pain shortness of breath or orthopnea  Opiate constipation: Has been using movantik dose of 12.5 mg daily. She is not really getting much benefit from constipation. She did use 25 mg samples when she was originally started on this last month and it seemed to work great at that dose. She denies any abdominal pain   Review Of Systems Outlined In HPI  Past Medical History  Diagnosis Date  . Depression 07/25/2013  . Generalized anxiety disorder 07/25/2013  . Hyperlipidemia 07/25/2013  . Chronic shoulder pain 07/25/2013    [facility] since cancer treatment.   . Mild cognitive impairment 07/25/2013    [facility] - thought to be secondary to cancer treatment   . Cancer Lung (right side)    Past Surgical History  Procedure Laterality Date  . Lung removal, partial Right   . Lung cancer surgery Right     pancost tumor  . Cholecystectomy    . Tubal ligation    . Tonsillectomy     Family History  Problem Relation Age of Onset  . Cancer Mother     breast  . Cancer Father    lung,brain,bone    History   Social History  . Marital Status: Married    Spouse Name: N/A  . Number of Children: N/A  . Years of Education: N/A   Occupational History  . Not on file.   Social History Main Topics  . Smoking status: Never Smoker   . Smokeless tobacco: Never Used  . Alcohol Use: No  . Drug Use: No  . Sexual Activity:    Partners: Male   Other Topics Concern  . Not on file   Social History Narrative     Objective: BP 114/81 mmHg  Pulse 111  Wt 182 lb (82.555 kg)  General: Alert and Oriented, No Acute Distress HEENT: Pupils equal, round, reactive to light. Conjunctivae clear.  Moist mucous membranes Lungs: Clear to auscultation bilaterally, no wheezing/ronchi/rales.  Comfortable work of breathing. Good air movement. Cardiac: Regular rate and rhythm. Normal S1/S2.  No murmurs, rubs, nor gallops.   Abdomen: Mild obesity soft Extremities: No peripheral edema.  Strong peripheral pulses.  Mental Status: No depression, anxiety, nor agitation. Skin: Warm and dry.  Assessment & Plan: Yvonne Gonzales was seen today for anxiety.  Diagnoses and all orders for this visit:  Generalized anxiety disorder Orders: -     clonazePAM (KLONOPIN) 0.5 MG tablet; Take 1 tablet (0.5 mg total) by mouth 2 (two) times daily as needed for anxiety.  Essential hypertension, benign  Depression  Therapeutic opioid induced constipation Orders: -  naloxegol oxalate (MOVANTIK) 25 MG TABS tablet; Take 1 tablet (25 mg total) by mouth daily.  Other orders -     escitalopram (LEXAPRO) 10 MG tablet; Take 1 tablet (10 mg total) by mouth daily.   Anxiety: Uncontrolled adding Lexapro continue clonazepam Depression: Uncontrolled fortunately Lexapro should help with this as well. Essential hypertension: Controlled continue diet and exercise interventions Opiate-induced constipation: Increase movantik to 25 mg daily.   Return in about 3 months (around 02/04/2015) for Anxiety and Mood.

## 2015-01-22 ENCOUNTER — Ambulatory Visit (INDEPENDENT_AMBULATORY_CARE_PROVIDER_SITE_OTHER): Payer: 59 | Admitting: Family Medicine

## 2015-01-22 ENCOUNTER — Encounter: Payer: Self-pay | Admitting: Family Medicine

## 2015-01-22 VITALS — BP 121/80 | HR 101 | Wt 183.0 lb

## 2015-01-22 DIAGNOSIS — K648 Other hemorrhoids: Secondary | ICD-10-CM

## 2015-01-22 DIAGNOSIS — F411 Generalized anxiety disorder: Secondary | ICD-10-CM

## 2015-01-22 DIAGNOSIS — K130 Diseases of lips: Secondary | ICD-10-CM | POA: Diagnosis not present

## 2015-01-22 DIAGNOSIS — F329 Major depressive disorder, single episode, unspecified: Secondary | ICD-10-CM

## 2015-01-22 DIAGNOSIS — F32A Depression, unspecified: Secondary | ICD-10-CM

## 2015-01-22 DIAGNOSIS — K644 Residual hemorrhoidal skin tags: Secondary | ICD-10-CM

## 2015-01-22 MED ORDER — ESCITALOPRAM OXALATE 10 MG PO TABS: 10.0000 mg | ORAL_TABLET | Freq: Every day | ORAL | Status: DC

## 2015-01-22 MED ORDER — TRIAMCINOLONE ACETONIDE 0.1 % MT PSTE: PASTE | OROMUCOSAL | Status: DC

## 2015-01-22 MED ORDER — HYDROCORTISONE ACETATE 25 MG RE SUPP: 25.0000 mg | Freq: Two times a day (BID) | RECTAL | Status: DC

## 2015-01-22 NOTE — Progress Notes (Signed)
CC: Yvonne Gonzales is a 54 y.o. female is here for f/u depression   Subjective: HPI:  Follow-up depression: She states that Lexapro has made a significant improvement with subjective depression. It's also seemed to help a lot with worrying about little things in her life. She denies any known side effects and is overall quite pleased with the response.  Complains of an external hemorrhoid that's been present off and on for matter of years. She's used Anusol and the past which greatly relieves discomfort. She's run out of this medication is requesting refills. Denies blood in stool. Denies diarrhea but does get occasional constipation due to opiates.  Complains of cracking, pain and redness in the corners of her lips. Symptoms come and go on a weekly basis. She is been unable to find anything that helps, Valtrex was completely ineffective. She denies mucosal lesions elsewhere. He denies fevers, chills nor mouth pain.   Review Of Systems Outlined In HPI  Past Medical History  Diagnosis Date  . Depression 07/25/2013  . Generalized anxiety disorder 07/25/2013  . Hyperlipidemia 07/25/2013  . Chronic shoulder pain 07/25/2013    [facility] since cancer treatment.   . Mild cognitive impairment 07/25/2013    [facility] - thought to be secondary to cancer treatment   . Cancer Lung (right side)    Past Surgical History  Procedure Laterality Date  . Lung removal, partial Right   . Lung cancer surgery Right     pancost tumor  . Cholecystectomy    . Tubal ligation    . Tonsillectomy     Family History  Problem Relation Age of Onset  . Cancer Mother     breast  . Cancer Father     lung,brain,bone    Social History   Social History  . Marital Status: Married    Spouse Name: N/A  . Number of Children: N/A  . Years of Education: N/A   Occupational History  . Not on file.   Social History Main Topics  . Smoking status: Never Smoker   . Smokeless tobacco:  Never Used  . Alcohol Use: No  . Drug Use: No  . Sexual Activity:    Partners: Male   Other Topics Concern  . Not on file   Social History Narrative     Objective: BP 120/78 mmHg  Pulse 101  Wt 183 lb (83.008 kg)  General: Alert and Oriented, No Acute Distress HEENT: Pupils equal, round, reactive to light. Conjunctivae clear.  Moist mucous membranes, mild cracking of the lips no abnormality in the corner of the lips. Lungs: clear and comfortable work of breathing Cardiac: Regular rate and rhythm.  Extremities: No peripheral edema.  Strong peripheral pulses.  Mental Status: No depression, anxiety, nor agitation. Skin: Warm and dry.  Assessment & Plan: Yvonne Gonzales was seen today for f/u depression.  Diagnoses and all orders for this visit:  Depression  Generalized anxiety disorder -     Discontinue: escitalopram (LEXAPRO) 10 MG tablet; Take 1 tablet (10 mg total) by mouth daily. -     escitalopram (LEXAPRO) 10 MG tablet; Take 1 tablet (10 mg total) by mouth daily.  Angular cheilitis -     triamcinolone (KENALOG) 0.1 % paste; Apply to corners of mouth BID as needed.  External hemorrhoid -     hydrocortisone (ANUSOL-HC) 25 MG suppository; Place 1 suppository (25 mg total) rectally 2 (two) times daily. Only PRN rectal irritation.   Depression and anxiety: Improved  and controlled continue daily Lexapro anglar chelitis: Start triamcinolone paste and had needed basis External hemorrhoid: Start Anusol on as-needed basis  Return in about 3 months (around 04/23/2015).

## 2015-02-04 ENCOUNTER — Ambulatory Visit: Payer: 59 | Admitting: Family Medicine

## 2015-03-18 ENCOUNTER — Other Ambulatory Visit: Payer: Self-pay | Admitting: Family Medicine

## 2015-03-20 ENCOUNTER — Encounter: Payer: Self-pay | Admitting: Family Medicine

## 2015-03-20 NOTE — Telephone Encounter (Signed)
Yvonne Gonzales, Rx placed in in-box ready for pickup/faxing.  

## 2015-04-23 ENCOUNTER — Encounter: Payer: Self-pay | Admitting: Family Medicine

## 2015-04-23 ENCOUNTER — Ambulatory Visit (INDEPENDENT_AMBULATORY_CARE_PROVIDER_SITE_OTHER): Payer: 59 | Admitting: Family Medicine

## 2015-04-23 DIAGNOSIS — Z0289 Encounter for other administrative examinations: Secondary | ICD-10-CM

## 2015-04-23 NOTE — Progress Notes (Signed)
No show 04/23/2015

## 2015-05-02 ENCOUNTER — Encounter: Payer: Self-pay | Admitting: Family Medicine

## 2015-05-02 ENCOUNTER — Ambulatory Visit (INDEPENDENT_AMBULATORY_CARE_PROVIDER_SITE_OTHER): Payer: 59 | Admitting: Family Medicine

## 2015-05-02 VITALS — BP 131/84 | HR 92 | Wt 186.0 lb

## 2015-05-02 DIAGNOSIS — F411 Generalized anxiety disorder: Secondary | ICD-10-CM

## 2015-05-02 DIAGNOSIS — F329 Major depressive disorder, single episode, unspecified: Secondary | ICD-10-CM

## 2015-05-02 DIAGNOSIS — G92 Toxic encephalopathy: Secondary | ICD-10-CM | POA: Diagnosis not present

## 2015-05-02 DIAGNOSIS — F32A Depression, unspecified: Secondary | ICD-10-CM

## 2015-05-02 DIAGNOSIS — G929 Unspecified toxic encephalopathy: Secondary | ICD-10-CM

## 2015-05-02 MED ORDER — CLONAZEPAM 0.5 MG PO TABS: 0.5000 mg | ORAL_TABLET | Freq: Two times a day (BID) | ORAL | Status: DC | PRN

## 2015-05-02 MED ORDER — DONEPEZIL HCL 10 MG PO TABS: 10.0000 mg | ORAL_TABLET | Freq: Every day | ORAL | Status: DC

## 2015-05-02 MED ORDER — ESCITALOPRAM OXALATE 10 MG PO TABS: 10.0000 mg | ORAL_TABLET | Freq: Every day | ORAL | Status: DC

## 2015-05-02 NOTE — Progress Notes (Signed)
CC: Yvonne Gonzales is a 54 y.o. female is here for Depression   Subjective: HPI:  Follow-up anxiety: She denies any anxiety provided she takes clonazepam twice a day on a daily basis. She denies any known side effects. She does report feeling sleepy most days out of the week and she never feels like she actually got meaningful rest. She wakes up feeling tired despite sleeping up to 10 hours a day. She can take naps easily throughout the day as well. She had a sleep study done but she forgot to press the start button on the recorder.  Follow depression: She tells me that she feels pretty good lately. Since I saw her last she has not had any depressive symptoms.   Follow-up memory loss: She tells me she believes that her memory issues are getting worse. Despite taking Ritalin she still has trouble with short-term memory and distractibility. She denies getting lost or any disorientation. She tells me blood work for this was done yesterday with her pain management clinic.   Review Of Systems Outlined In HPI  Past Medical History  Diagnosis Date  . Depression 07/25/2013  . Generalized anxiety disorder 07/25/2013  . Hyperlipidemia 07/25/2013  . Chronic shoulder pain 07/25/2013    [facility] since cancer treatment.   . Mild cognitive impairment 07/25/2013    [facility] - thought to be secondary to cancer treatment   . Cancer ([place]) Lung (right side)    Past Surgical History  Procedure Laterality Date  . Lung removal, partial Right   . Lung cancer surgery Right     pancost tumor  . Cholecystectomy    . Tubal ligation    . Tonsillectomy     Family History  Problem Relation Age of Onset  . Cancer Mother     breast  . Cancer Father     lung,brain,bone    Social History   Social History  . Marital Status: Married    Spouse Name: N/A  . Number of Children: N/A  . Years of Education: N/A   Occupational History  . Not on file.   Social History Main Topics   . Smoking status: Never Smoker   . Smokeless tobacco: Never Used  . Alcohol Use: No  . Drug Use: No  . Sexual Activity:    Partners: Male   Other Topics Concern  . Not on file   Social History Narrative     Objective: BP 131/84 mmHg  Pulse 92  Wt 186 lb (84.369 kg)  Vital signs reviewed. General: Alert and Oriented, No Acute Distress HEENT: Pupils equal, round, reactive to light. Conjunctivae clear.  External ears unremarkable.  Moist mucous membranes. Lungs: Clear and comfortable work of breathing, speaking in full sentences without accessory muscle use. Cardiac: Regular rate and rhythm.  Neuro: CN II-XII grossly intact, gait normal. Extremities: No peripheral edema.  Strong peripheral pulses.  Mental Status: No depression, anxiety, nor agitation. Logical though process. Skin: Warm and dry.  Assessment & Plan: [name] was seen today for depression.  Diagnoses and all orders for this visit:  Generalized anxiety disorder -     escitalopram (LEXAPRO) 10 MG tablet; Take 1 tablet (10 mg total) by mouth daily.  Toxic encephalopathy  Depression  Other orders -     donepezil (ARICEPT) 10 MG tablet; Take 1 tablet (10 mg total) by mouth at bedtime. To help with memory. -     clonazePAM (KLONOPIN) 0.5 MG tablet; Take 1 tablet (  0.5 mg total) by mouth 2 (two) times daily as needed. for anxiety   Anxiety: Controlled with Lexapro and clonazepam Depression: Controlled with Lexapro Memory loss: Discussed that Aricept is primarily indicated for Alzheimer's dementia however it's worth trying to see if this helps with some of her memory. I've encouraged her to ask her neurologist and sleep specialist if she can have a in lab sleep test since she had difficulty doing a sleep test at home. I've also sent a message to our billing department asking if they can remove her $50 missed visit from last month due to her memory loss.   Return in about 3 months (around 07/31/2015).

## 2015-05-16 ENCOUNTER — Other Ambulatory Visit: Payer: Self-pay | Admitting: Family Medicine

## 2015-05-27 ENCOUNTER — Telehealth: Payer: Self-pay

## 2015-05-27 NOTE — Telephone Encounter (Signed)
Our billing department [facility]) told me two weeks ago that they are removing this from her account.

## 2015-05-27 NOTE — Telephone Encounter (Signed)
Pt notified and was transferred to [facility]. -Izell Rentchler, [place]

## 2015-05-27 NOTE — Telephone Encounter (Signed)
Pt is complaining of a bill that she received for a no show ion 04/23/15.  She said that during her visit on 05/02/15 you agreed to not charge her the no show.

## 2015-07-18 ENCOUNTER — Encounter: Payer: Self-pay | Admitting: Family Medicine

## 2015-07-18 MED ORDER — CLONAZEPAM 0.5 MG PO TABS: 0.5000 mg | ORAL_TABLET | Freq: Two times a day (BID) | ORAL | Status: DC | PRN

## 2015-07-18 NOTE — Telephone Encounter (Signed)
Yvonne Gonzales will notify patient and fax

## 2015-08-01 ENCOUNTER — Encounter: Payer: Self-pay | Admitting: Family Medicine

## 2015-08-01 ENCOUNTER — Ambulatory Visit (INDEPENDENT_AMBULATORY_CARE_PROVIDER_SITE_OTHER): Payer: 59 | Admitting: Family Medicine

## 2015-08-01 VITALS — BP 137/84 | HR 93 | Wt 187.0 lb

## 2015-08-01 DIAGNOSIS — G4700 Insomnia, unspecified: Secondary | ICD-10-CM

## 2015-08-01 DIAGNOSIS — F411 Generalized anxiety disorder: Secondary | ICD-10-CM | POA: Diagnosis not present

## 2015-08-01 DIAGNOSIS — G3184 Mild cognitive impairment, so stated: Secondary | ICD-10-CM | POA: Diagnosis not present

## 2015-08-01 DIAGNOSIS — I10 Essential (primary) hypertension: Secondary | ICD-10-CM | POA: Diagnosis not present

## 2015-08-01 MED ORDER — RAMELTEON 8 MG PO TABS: 8.0000 mg | ORAL_TABLET | Freq: Every day | ORAL | Status: DC

## 2015-08-01 NOTE — Progress Notes (Signed)
CC: Yvonne Gonzales is a 55 y.o. female is here for Anxiety   Subjective: HPI:  Follow essential hypertension: Currently not taking any antihypertensive medications. Denies chest pain shortness of breath orthopnea nor peripheral edema. No outside blood pressures to report.  Follow-up anxiety: She tells me that she's not had a panic attack since she saw me last and she is not experienced any anxiety over the past 3 months. She's happy with Lexapro denies any known side effects. Denies depression.  She tells me that Aricept was actually helping her with some of her cognitive impairment however after taking it for one week it caused intolerable vivid dreams and sleep paralysis. Symptoms went away the days she stopped taking the medication and if she restarted it again in one week she will get the symptoms again.  Complains of continued daytime fatigue until she takes her Ritalin. It happens every day she always Feels as if she didn't get any restorative sleep benefit from amitriptyline however causes intolerable sedation the following morning. Review Of Systems Outlined In HPI  Past Medical History  Diagnosis Date  . Depression 07/25/2013  . Generalized anxiety disorder 07/25/2013  . Hyperlipidemia 07/25/2013  . Chronic shoulder pain 07/25/2013    [facility] since cancer treatment.   . Mild cognitive impairment 07/25/2013    [facility] - thought to be secondary to cancer treatment   . Cancer ([place]) Lung (right side)    Past Surgical History  Procedure Laterality Date  . Lung removal, partial Right   . Lung cancer surgery Right     pancost tumor  . Cholecystectomy    . Tubal ligation    . Tonsillectomy     Family History  Problem Relation Age of Onset  . Cancer Mother     breast  . Cancer Father     lung,brain,bone    Social History   Social History  . Marital Status: Married    Spouse Name: N/A  . Number of Children: N/A  . Years of Education: N/A    Occupational History  . Not on file.   Social History Main Topics  . Smoking status: Never Smoker   . Smokeless tobacco: Never Used  . Alcohol Use: No  . Drug Use: No  . Sexual Activity:    Partners: Male   Other Topics Concern  . Not on file   Social History Narrative     Objective: BP 137/84 mmHg  Pulse 93  Wt 187 lb (84.823 kg)  Vital signs reviewed. General: Alert and Oriented, No Acute Distress HEENT: Pupils equal, round, reactive to light. Conjunctivae clear.  External ears unremarkable.  Moist mucous membranes. Lungs: Clear and comfortable work of breathing, speaking in full sentences without accessory muscle use. Cardiac: Regular rate and rhythm.  Neuro: CN II-XII grossly intact, gait normal. Extremities: No peripheral edema.  Strong peripheral pulses.  Mental Status: No depression, anxiety, nor agitation. Logical though process. Skin: Warm and dry.  Assessment & Plan: Yvonne Gonzales was seen today for anxiety.  Diagnoses and all orders for this visit:  Essential hypertension, benign  Generalized anxiety disorder  Mild cognitive impairment  Insomnia -     ramelteon (ROZEREM) 8 MG tablet; Take 1 tablet (8 mg total) by mouth at bedtime.   Essential hypertension: Controlled with diet alone   generalized anxiety disorder: Controlled with Lexapro Mild cognitive impairment: Hoping that better control of her insomnia will improve her cognition after starting Rozerem  Return in about  3 months (around 11/01/2015) for Mood.

## 2015-08-07 ENCOUNTER — Telehealth: Payer: Self-pay | Admitting: *Deleted

## 2015-08-07 NOTE — Telephone Encounter (Signed)
PA initiated through covermymeds 08/04/15. Checked states and its still pending

## 2015-08-12 ENCOUNTER — Telehealth: Payer: Self-pay | Admitting: Family Medicine

## 2015-08-12 MED ORDER — ZOLPIDEM TARTRATE 10 MG PO TABS: 10.0000 mg | ORAL_TABLET | Freq: Every evening | ORAL | Status: DC | PRN

## 2015-08-12 NOTE — Telephone Encounter (Signed)
Will you please let patient know that [employer] has denied coverage of rozerem unless she's already tried two of the following: Ambien, Sonata, or Lunesta.  Her past medical history reflects that she's tried [place] but she'll need to at least try Ambien for two weeks before her insurance will consider coverage of Rozerm. Rx in your in box.

## 2015-08-12 NOTE — Telephone Encounter (Signed)
Insurance denied rozeram due to patient not having tried and failed at least two weeks orcontraindication or intolerance to two of either ambien,sonata,lunesta  Denial letter placed in Dr. Lajoyce Lauber box

## 2015-08-12 NOTE — Telephone Encounter (Signed)
Pt.notified

## 2015-09-15 ENCOUNTER — Other Ambulatory Visit: Payer: Self-pay | Admitting: Family Medicine

## 2015-10-04 ENCOUNTER — Encounter: Payer: Self-pay | Admitting: Family Medicine

## 2015-10-06 MED ORDER — ZOLPIDEM TARTRATE 10 MG PO TABS: 10.0000 mg | ORAL_TABLET | Freq: Every evening | ORAL | Status: DC | PRN

## 2015-10-16 ENCOUNTER — Other Ambulatory Visit: Payer: Self-pay | Admitting: Family Medicine

## 2015-10-16 MED ORDER — CLONAZEPAM 0.5 MG PO TABS: 0.5000 mg | ORAL_TABLET | Freq: Two times a day (BID) | ORAL | Status: DC | PRN

## 2015-10-22 ENCOUNTER — Other Ambulatory Visit: Payer: Self-pay | Admitting: Family Medicine

## 2015-10-22 ENCOUNTER — Ambulatory Visit (INDEPENDENT_AMBULATORY_CARE_PROVIDER_SITE_OTHER): Payer: 59

## 2015-10-22 ENCOUNTER — Ambulatory Visit (INDEPENDENT_AMBULATORY_CARE_PROVIDER_SITE_OTHER): Payer: 59 | Admitting: Family Medicine

## 2015-10-22 ENCOUNTER — Encounter: Payer: Self-pay | Admitting: Family Medicine

## 2015-10-22 VITALS — BP 121/85 | HR 114 | Wt 187.0 lb

## 2015-10-22 DIAGNOSIS — S92351A Displaced fracture of fifth metatarsal bone, right foot, initial encounter for closed fracture: Secondary | ICD-10-CM | POA: Diagnosis not present

## 2015-10-22 DIAGNOSIS — S92301A Fracture of unspecified metatarsal bone(s), right foot, initial encounter for closed fracture: Secondary | ICD-10-CM

## 2015-10-22 DIAGNOSIS — Z139 Encounter for screening, unspecified: Secondary | ICD-10-CM

## 2015-10-22 DIAGNOSIS — X58XXXA Exposure to other specified factors, initial encounter: Secondary | ICD-10-CM | POA: Diagnosis not present

## 2015-10-22 DIAGNOSIS — M79671 Pain in right foot: Secondary | ICD-10-CM

## 2015-10-22 NOTE — Progress Notes (Signed)
CC: Yvonne Gonzales is a 55 y.o. female is here for Foot Injury   Subjective: HPI:  2 weeks ago she fell while playing with her dog. She doesn't recall the mechanics of the fall but she ended up falling on her right side. She had immediate chest wall pain that is now gone but also had immediate right lateral foot pain that has persisted and if anything has been getting worse since onset. It's worse with bearing weight or flexion. It's localized in the lateral surface of the foot just about an inch proximal from the toes. She's had bruising ever since the accident but this is slowly resolving as well. No longer swollen. No other interventions other than trying to walk on the medial aspect of her foot. Pain is mild to moderate in severity. She denies any joint pain elsewhere. She denies any balance issues   Review Of Systems Outlined In HPI  Past Medical History  Diagnosis Date  . Depression 07/25/2013  . Generalized anxiety disorder 07/25/2013  . Hyperlipidemia 07/25/2013  . Chronic shoulder pain 07/25/2013    [facility] since cancer treatment.   . Mild cognitive impairment 07/25/2013    [facility] - thought to be secondary to cancer treatment   . Cancer ([place]) Lung (right side)    Past Surgical History  Procedure Laterality Date  . Lung removal, partial Right   . Lung cancer surgery Right     pancost tumor  . Cholecystectomy    . Tubal ligation    . Tonsillectomy     Family History  Problem Relation Age of Onset  . Cancer Mother     breast  . Cancer Father     lung,brain,bone    Social History   Social History  . Marital Status: Married    Spouse Name: N/A  . Number of Children: N/A  . Years of Education: N/A   Occupational History  . Not on file.   Social History Main Topics  . Smoking status: Never Smoker   . Smokeless tobacco: Never Used  . Alcohol Use: No  . Drug Use: No  . Sexual Activity:    Partners: Male   Other Topics Concern   . Not on file   Social History Narrative     Objective: BP 121/85 mmHg  Pulse 114  Wt 187 lb (84.823 kg)  Vital signs reviewed. General: Alert and Oriented, No Acute Distress HEENT: Pupils equal, round, reactive to light. Conjunctivae clear.  External ears unremarkable.  Moist mucous membranes. Lungs: Clear and comfortable work of breathing, speaking in full sentences without accessory muscle use. Cardiac: Regular rate and rhythm.  Neuro: CN II-XII grossly intact, gait normal. Extremities: No peripheral edema.  Strong peripheral pulses.  Right foot exam: No pain at the base of the fifth metatarsal. She localizes her pain near the heads of the fourth and fifth metatarsals. There is mild ecchymosis on the dorsum of the foot where she localizes her pain. She has full range of motion and strength in the foot Mental Status: No depression, anxiety, nor agitation. Logical though process. Skin: Warm and dry.  Assessment & Plan: Yvonne Gonzales was seen today for foot injury.  Diagnoses and all orders for this visit:  Right foot pain -     DG Foot Complete Right -     VITAMIN D 25 Hydroxy (Vit-D Deficiency, Fractures)  Metatarsal fracture, right, closed, initial encounter -     VITAMIN D 25 Hydroxy (Vit-D Deficiency,  Fractures) -     DG Bone Density   X-ray actually shows a closed fracture of the fifth metatarsal. She was placed in a immobilizer and encouraged to wear this during all waking hours unless in the shower or driving.  Rule out osteoporosis and vitamin D deficiency. I like her to come back in about 2 weeks for reevaluation.  Return if symptoms worsen or fail to improve.

## 2015-10-23 ENCOUNTER — Other Ambulatory Visit: Payer: Self-pay | Admitting: Gastroenterology

## 2015-10-23 DIAGNOSIS — K861 Other chronic pancreatitis: Secondary | ICD-10-CM

## 2015-10-23 LAB — VITAMIN D 25 HYDROXY (VIT D DEFICIENCY, FRACTURES): Vit D, 25-Hydroxy: 47 ng/mL (ref 30–100)

## 2015-10-29 ENCOUNTER — Other Ambulatory Visit: Payer: 59

## 2015-10-29 ENCOUNTER — Ambulatory Visit: Payer: 59

## 2015-10-31 ENCOUNTER — Other Ambulatory Visit: Payer: 59

## 2015-11-04 ENCOUNTER — Other Ambulatory Visit: Payer: Self-pay

## 2015-11-04 ENCOUNTER — Ambulatory Visit: Payer: 59 | Admitting: Family Medicine

## 2015-11-04 MED ORDER — CLONAZEPAM 0.5 MG PO TABS: 0.5000 mg | ORAL_TABLET | Freq: Two times a day (BID) | ORAL | Status: DC | PRN

## 2015-11-05 ENCOUNTER — Ambulatory Visit (INDEPENDENT_AMBULATORY_CARE_PROVIDER_SITE_OTHER): Payer: 59

## 2015-11-05 DIAGNOSIS — M8580 Other specified disorders of bone density and structure, unspecified site: Secondary | ICD-10-CM

## 2015-11-05 DIAGNOSIS — Z139 Encounter for screening, unspecified: Secondary | ICD-10-CM

## 2015-11-05 DIAGNOSIS — K861 Other chronic pancreatitis: Secondary | ICD-10-CM

## 2015-11-05 DIAGNOSIS — R928 Other abnormal and inconclusive findings on diagnostic imaging of breast: Secondary | ICD-10-CM

## 2015-11-05 DIAGNOSIS — N281 Cyst of kidney, acquired: Secondary | ICD-10-CM | POA: Diagnosis not present

## 2015-11-05 MED ORDER — IOPAMIDOL (ISOVUE-370) INJECTION 76%: 100.0000 mL | Freq: Once | INTRAVENOUS | Status: DC | PRN

## 2015-11-07 ENCOUNTER — Ambulatory Visit (INDEPENDENT_AMBULATORY_CARE_PROVIDER_SITE_OTHER): Payer: 59 | Admitting: Family Medicine

## 2015-11-07 ENCOUNTER — Encounter: Payer: Self-pay | Admitting: Family Medicine

## 2015-11-07 VITALS — BP 110/76 | HR 99 | Wt 191.0 lb

## 2015-11-07 DIAGNOSIS — G92 Toxic encephalopathy: Secondary | ICD-10-CM

## 2015-11-07 DIAGNOSIS — G4700 Insomnia, unspecified: Secondary | ICD-10-CM

## 2015-11-07 DIAGNOSIS — F411 Generalized anxiety disorder: Secondary | ICD-10-CM

## 2015-11-07 DIAGNOSIS — G929 Unspecified toxic encephalopathy: Secondary | ICD-10-CM

## 2015-11-07 MED ORDER — CLONAZEPAM 0.5 MG PO TABS: 0.5000 mg | ORAL_TABLET | Freq: Two times a day (BID) | ORAL | Status: DC | PRN

## 2015-11-07 MED ORDER — ESCITALOPRAM OXALATE 10 MG PO TABS: 10.0000 mg | ORAL_TABLET | Freq: Every day | ORAL | Status: DC

## 2015-11-07 MED ORDER — ZOLPIDEM TARTRATE 10 MG PO TABS: 10.0000 mg | ORAL_TABLET | Freq: Every evening | ORAL | Status: DC | PRN

## 2015-11-07 NOTE — Progress Notes (Signed)
CC: Yvonne Gonzales is a 55 y.o. female is here for Depression   Subjective: HPI:  Follow-up anxiety: Since I saw her last she's not had any panic attacks or any degree of anxiety that bothered her quality of life. She denies any depression. No current mental disturbance other than chronic cognitive changes ever since receiving chemotherapy.  Follow-up insomnia: She's been taking Ambien every other day because she was only getting 15 pills at a time. She tells me on the nights that she takes this medication she wakes up feeling well rested. On the nights that she is not taking this medication she'll take a Tylenol PM and she dreams vivid dreams on it long causing her to feel more down the morning like she can actually sleep. She denies any known side effects of Ambien. She wants know she get a prescription to take on a daily basis.  Review Of Systems Outlined In HPI  Past Medical History  Diagnosis Date  . Depression 07/25/2013  . Generalized anxiety disorder 07/25/2013  . Hyperlipidemia 07/25/2013  . Chronic shoulder pain 07/25/2013    [facility] since cancer treatment.   . Mild cognitive impairment 07/25/2013    [facility] - thought to be secondary to cancer treatment   . Cancer ([place]) Lung (right side)    Past Surgical History  Procedure Laterality Date  . Lung removal, partial Right   . Lung cancer surgery Right     pancost tumor  . Cholecystectomy    . Tubal ligation    . Tonsillectomy     Family History  Problem Relation Age of Onset  . Cancer Mother     breast  . Cancer Father     lung,brain,bone    Social History   Social History  . Marital Status: Married    Spouse Name: N/A  . Number of Children: N/A  . Years of Education: N/A   Occupational History  . Not on file.   Social History Main Topics  . Smoking status: Never Smoker   . Smokeless tobacco: Never Used  . Alcohol Use: No  . Drug Use: No  . Sexual Activity:    Partners:  Male   Other Topics Concern  . Not on file   Social History Narrative     Objective: BP 110/76 mmHg  Pulse 99  Wt 191 lb (86.637 kg)  Vital signs reviewed. General: Alert and Oriented, No Acute Distress HEENT: Pupils equal, round, reactive to light. Conjunctivae clear.  External ears unremarkable.  Moist mucous membranes. Lungs: Clear and comfortable work of breathing, speaking in full sentences without accessory muscle use. Cardiac: Regular rate and rhythm.  Neuro: CN II-XII grossly intact, gait normal. Extremities: No peripheral edema.  Strong peripheral pulses.  Mental Status: No depression, anxiety, nor agitation. Logical though process. Skin: Warm and dry.  Assessment & Plan: Yvonne Gonzales was seen today for depression.  Diagnoses and all orders for this visit:  Generalized anxiety disorder -     escitalopram (LEXAPRO) 10 MG tablet; Take 1 tablet (10 mg total) by mouth daily.  Toxic encephalopathy  Insomnia  Other orders -     clonazePAM (KLONOPIN) 0.5 MG tablet; Take 1 tablet (0.5 mg total) by mouth 2 (two) times daily as needed. for anxiety -     zolpidem (AMBIEN) 10 MG tablet; Take 1 tablet (10 mg total) by mouth at bedtime as needed for sleep.   Anxiety controlled on Lexapro and twice a day clonazepam. 90  day supply has been sent for both of these to [facility]. Toxic encephalopathy: No evidence of decline today seems stable Insomnia: Great improvement with Ambien, continue taking but again taking on a daily basis no longer taking Tylenol PM.  25 minutes spent face-to-face during visit today of which at least 50% was counseling or coordinating care regarding: 1. Generalized anxiety disorder   2. Toxic encephalopathy   3. Insomnia       Return if symptoms worsen or fail to improve.

## 2015-11-12 ENCOUNTER — Other Ambulatory Visit: Payer: Self-pay | Admitting: Family Medicine

## 2015-11-12 DIAGNOSIS — R928 Other abnormal and inconclusive findings on diagnostic imaging of breast: Secondary | ICD-10-CM

## 2015-11-19 ENCOUNTER — Ambulatory Visit
Admission: RE | Admit: 2015-11-19 | Discharge: 2015-11-19 | Disposition: A | Discharge status: Home / Self Care | Payer: 59 | Source: Ambulatory Visit | Attending: Family Medicine | Admitting: Family Medicine

## 2015-11-19 DIAGNOSIS — N6310 Unspecified lump in the right breast, unspecified quadrant: Secondary | ICD-10-CM

## 2015-11-19 DIAGNOSIS — R928 Other abnormal and inconclusive findings on diagnostic imaging of breast: Secondary | ICD-10-CM

## 2015-12-02 ENCOUNTER — Other Ambulatory Visit: Payer: Self-pay

## 2015-12-02 MED ORDER — CLONAZEPAM 0.5 MG PO TABS: 0.5000 mg | ORAL_TABLET | Freq: Two times a day (BID) | ORAL | Status: DC | PRN

## 2015-12-02 MED ORDER — ZOLPIDEM TARTRATE 10 MG PO TABS: 10.0000 mg | ORAL_TABLET | Freq: Every evening | ORAL | Status: DC | PRN

## 2015-12-19 ENCOUNTER — Ambulatory Visit (INDEPENDENT_AMBULATORY_CARE_PROVIDER_SITE_OTHER): Payer: 59 | Admitting: Family Medicine

## 2015-12-19 DIAGNOSIS — M722 Plantar fascial fibromatosis: Secondary | ICD-10-CM

## 2015-12-21 ENCOUNTER — Encounter: Payer: Self-pay | Admitting: Family Medicine

## 2015-12-21 NOTE — Progress Notes (Signed)
CC: Yvonne Gonzales is a 55 y.o. female is here for Foot Pain   Subjective: HPI:  Small mass on the plantar surface of right foot, midfoot near the arch. Painless. Noticed one week ago. Not getting smaller or larger since onset. Persistent. No skin changes. No interventions as of yet, never had this before. Denies any new joint pain. No similar lesions elsewhere.   Review Of Systems Outlined In HPI  Past Medical History:  Diagnosis Date  . Cancer ([place]) Lung (right side)  . Chronic shoulder pain 07/25/2013   [facility] since cancer treatment.   . Depression 07/25/2013  . Generalized anxiety disorder 07/25/2013  . Hyperlipidemia 07/25/2013  . Mild cognitive impairment 07/25/2013   [facility] Neurology - thought to be secondary to cancer treatment     Past Surgical History:  Procedure Laterality Date  . CHOLECYSTECTOMY    . LUNG CANCER SURGERY Right    pancost tumor  . LUNG REMOVAL, PARTIAL Right   . TONSILLECTOMY    . TUBAL LIGATION     Family History  Problem Relation Age of Onset  . Cancer Mother     breast  . Cancer Father     lung,brain,bone    Social History   Social History  . Marital status: Married    Spouse name: N/A  . Number of children: N/A  . Years of education: N/A   Occupational History  . Not on file.   Social History Main Topics  . Smoking status: Never Smoker  . Smokeless tobacco: Never Used  . Alcohol use No  . Drug use: No  . Sexual activity: Yes    Partners: Male   Other Topics Concern  . Not on file   Social History Narrative  . No narrative on file     Objective: BP 121/83   Pulse (!) 120   Wt 190 lb (86.2 kg)   BMI 32.61 kg/m   General: Alert and Oriented, No Acute Distress HEENT: Pupils equal, round, reactive to light. Conjunctivae clear.  Moist mucous membranes. Lungs: clear and comfortable work of breathing Cardiac: Regular rate and rhythm.  Extremities: No peripheral edema.  Strong peripheral pulses.  0.5 cm painless rubbery mass which moves with the plantar faschia of the right foot Mental Status: No depression, anxiety, nor agitation. Skin: Warm and dry.  Assessment & Plan: Yvonne Gonzales was seen today for foot pain.  Diagnoses and all orders for this visit:  Plantar fibromatosis   Follow up with Dr. Darene Lamer or Georgina Snell for consideration of custom orthotics. Discussed at home stretching of the plantar faschia and achilles tendon  No Follow-up on file.

## 2015-12-30 ENCOUNTER — Ambulatory Visit (INDEPENDENT_AMBULATORY_CARE_PROVIDER_SITE_OTHER): Payer: 59 | Admitting: Family Medicine

## 2015-12-30 VITALS — BP 134/86 | HR 104 | Wt 190.0 lb

## 2015-12-30 DIAGNOSIS — Z23 Encounter for immunization: Secondary | ICD-10-CM | POA: Diagnosis not present

## 2015-12-30 DIAGNOSIS — M722 Plantar fascial fibromatosis: Secondary | ICD-10-CM | POA: Diagnosis not present

## 2015-12-30 NOTE — Patient Instructions (Signed)
Thank you for coming in today. Return for a well visit in the next few months.   Td Vaccine (Tetanus and Diphtheria): What You Need to Know 1. Why get vaccinated? Tetanus  and diphtheria are very serious diseases. They are rare in the [place] today, but people who do become infected often have severe complications. Td vaccine is used to protect adolescents and adults from both of these diseases. Both tetanus and diphtheria are infections caused by bacteria. Diphtheria spreads from person to person through coughing or sneezing. Tetanus-causing bacteria enter the body through cuts, scratches, or wounds. TETANUS (Lockjaw) causes painful muscle tightening and stiffness, usually all over the body.  It can lead to tightening of muscles in the head and neck so you can't open your mouth, swallow, or sometimes even breathe. Tetanus kills about 1 out of every 10 people who are infected even after receiving the best medical care. DIPHTHERIA can cause a thick coating to form in the back of the throat.  It can lead to breathing problems, paralysis, heart failure, and death. Before vaccines, as many as 200,000 cases of diphtheria and hundreds of cases of tetanus were reported in the [place] each year. Since vaccination began, reports of cases for both diseases have dropped by about 99%. 2. Td vaccine Td vaccine can protect adolescents and adults from tetanus and diphtheria. Td is usually given as a booster dose every 10 years but it can also be given earlier after a severe and dirty wound or burn. Another vaccine, called Tdap, which protects against pertussis in addition to tetanus and diphtheria, is sometimes recommended instead of Td vaccine. Your doctor or the person giving you the vaccine can give you more information. Td may safely be given at the same time as other vaccines. 3. Some people should not get this vaccine  A person who has ever had a life-threatening allergic reaction after a  previous dose of any tetanus or diphtheria containing vaccine, OR has a severe allergy to any part of this vaccine, should not get Td vaccine. Tell the person giving the vaccine about any severe allergies.  Talk to your doctor if you:  have seizures or another nervous system problem,  had severe pain or swelling after any vaccine containing diphtheria or tetanus,  ever had a condition called Guillain Barre Syndrome (GBS),  aren't feeling well on the day the shot is scheduled. 4. Risks of a vaccine reaction With any medicine, including vaccines, there is a chance of side effects. These are usually mild and go away on their own. Serious reactions are also possible but are rare. Most people who get Td vaccine do not have any problems with it. Mild Problems  following Td vaccine: (Did not interfere with activities)  Pain where the shot was given (about 8 people in 10)  Redness or swelling where the shot was given (about 1 person in 4)  Mild fever (rare)  Headache (about 1 person in 4)  Tiredness (about 1 person in 4) Moderate Problems following Td vaccine: (Interfered with activities, but did not require medical attention)  Fever over 102F (rare) Severe Problems  following Td vaccine: (Unable to perform usual activities; required medical attention)  Swelling, severe pain, bleeding and/or redness in the arm where the shot was given (rare). Problems that could happen after any vaccine:  People sometimes faint after a medical procedure, including vaccination. Sitting or lying down for about 15 minutes can help prevent fainting, and injuries caused by a  fall. Tell your doctor if you feel dizzy, or have vision changes or ringing in the ears.  Some people get severe pain in the shoulder and have difficulty moving the arm where a shot was given. This happens very rarely.  Any medication can cause a severe allergic reaction. Such reactions from a vaccine are very rare, estimated at fewer  than 1 in a million doses, and would happen within a few minutes to a few hours after the vaccination. As with any medicine, there is a very remote chance of a vaccine causing a serious injury or death. The safety of vaccines is always being monitored. For more information, visit: http://www.aguilar.org/ 5. What if there is a serious reaction? What should I look for?  Look for anything that concerns you, such as signs of a severe allergic reaction, very high fever, or unusual behavior. Signs of a severe allergic reaction can include hives, swelling of the face and throat, difficulty breathing, a fast heartbeat, dizziness, and weakness. These would usually start a few minutes to a few hours after the vaccination. What should I do?  If you think it is a severe allergic reaction or other emergency that can't wait, call 9-1-1 or get the person to the nearest hospital. Otherwise, call your doctor.  Afterward, the reaction should be reported to the Vaccine Adverse Event Reporting System (VAERS). Your doctor might file this report, or you can do it yourself through the VAERS web site at www.vaers.SamedayNews.es, or by calling (418)054-4152. VAERS does not give medical advice. 6. The National Vaccine Injury Compensation Program The [employer] Vaccine Injury Compensation Program (VICP) is a federal program that was created to compensate people who may have been injured by certain vaccines. Persons who believe they may have been injured by a vaccine can learn about the program and about filing a claim by calling 575-825-9761 or visiting the [place] website at GoldCloset.com.ee. There is a time limit to file a claim for compensation. 7. How can I learn more?  Ask your doctor. He or she can give you the vaccine package insert or suggest other sources of information.  Call your local or state health department.  Contact the Centers for Disease Control and Prevention (CDC):  Call (541) 744-5458  (1-800-CDC-INFO)  Visit CDC's website at http://hunter.com/ CDC Td Vaccine VIS (07/17/13)   This information is not intended to replace advice given to you by your health care provider. Make sure you discuss any questions you have with your health care provider.   Document Released: 03/07/2006 Document Revised: 05/31/2014 Document Reviewed: 08/22/2013 Elsevier Interactive Patient Education [employer].

## 2015-12-30 NOTE — Progress Notes (Signed)
Yvonne Gonzales is a 55 y.o. female who presents to [place] today for establish care. Patient's primary care provider is leaving the practice and she is here today to establish care and discuss a nodule on the plantar aspect of her right foot. She has a pertinent medical history for right lung cancer 8 years ago successfully treated with surgery chemotherapy and radiation. This resulted in mild toxic encephalopathy with cognitive impairment. She is currently disabled. Overall she feels well with no chest pains palpitations or shortness of breath. He notes the nodule in the right foot is nontender and popped up after she broke her foot a few months ago.   Past Medical History:  Diagnosis Date  . Cancer ([place]) Lung (right side)  . Chronic shoulder pain 07/25/2013   [facility] since cancer treatment.   . Depression 07/25/2013  . Generalized anxiety disorder 07/25/2013  . Hyperlipidemia 07/25/2013  . Mild cognitive impairment 07/25/2013   [facility] Neurology - thought to be secondary to cancer treatment    Past Surgical History:  Procedure Laterality Date  . CHOLECYSTECTOMY    . LUNG CANCER SURGERY Right    pancost tumor  . LUNG REMOVAL, PARTIAL Right   . TONSILLECTOMY    . TUBAL LIGATION     Social History  Substance Use Topics  . Smoking status: Never Smoker  . Smokeless tobacco: Never Used  . Alcohol use No   family history includes Cancer in her father and mother.  ROS as above:  Medications: Current Outpatient Prescriptions  Medication Sig Dispense Refill  . cholecalciferol (VITAMIN D) 1000 UNITS tablet Take 1,000 Units by mouth daily.    . clonazePAM (KLONOPIN) 0.5 MG tablet Take 1 tablet (0.5 mg total) by mouth 2 (two) times daily as needed. for anxiety 180 tablet 0  . escitalopram (LEXAPRO) 10 MG tablet Take 1 tablet (10 mg total) by  mouth daily. 90 tablet 2  . hydrocortisone (ANUSOL-HC) 25 MG suppository Place 1 suppository (25 mg total) rectally 2 (two) times daily. Only PRN rectal irritation. 12 suppository 11  . KRILL OIL PO Take by mouth.    Marland Kitchen MAGNESIUM PO Take by mouth.    . methylphenidate (RITALIN) 20 MG tablet Take 20 mg by mouth 2 (two) times daily.    Marland Kitchen morphine (MS CONTIN) 15 MG 12 hr tablet Take 15 mg by mouth every 12 (twelve) hours.    Marland Kitchen morphine (MS CONTIN) 30 MG 12 hr tablet   0  . Multiple Vitamin (MULTIVITAMIN) tablet Take 1 tablet by mouth daily.    . naloxegol oxalate (MOVANTIK) 25 MG TABS tablet Take 1 tablet (25 mg total) by mouth daily. 30 tablet 11  . Probiotic Product (PROBIOTIC DAILY PO) Take by mouth.    . triamcinolone (KENALOG) 0.1 % paste Apply to corners of mouth BID as needed. 5 g 12  . zolpidem (AMBIEN) 10 MG tablet Take 1 tablet (10 mg total) by mouth at bedtime as needed for sleep. 90 tablet 0   No current facility-administered medications for this visit.    Facility-Administered Medications Ordered in Other Visits  Medication Dose Route Frequency Provider Last Rate Last Dose  . iopamidol (ISOVUE-370) 76 % injection 100 mL  100 mL Intravenous Once PRN Marcial Pacas, DO       Allergies  Allergen Reactions  . Latex Hives    Red rash  . Amphetamine-Dextroamphetamine  HA  . Aricept [Donepezil Hcl]     Sleep paralysis  . Diclofenac Sodium     Voltaren  . Nsaids Nausea And Vomiting  . Tape      Exam:  BP 134/86   Pulse (!) 104   Wt 190 lb (86.2 kg)   BMI 32.61 kg/m  Gen: Well NAD HEENT: EOMI,  MMM Lungs: Normal work of breathing. CTABL Heart: RRR no MRG Abd: NABS, Soft. Nondistended, Nontender Exts: Brisk capillary refill, warm and well perfused.  Right foot: Tiny nontender non-erythematous nodule plantar aspect of the mid plantar fascia. Normal motion. Pulses capillary refill and sensation intact.  No results found for this or any previous visit (from the past 24  hour(s)). No results found.    Assessment and Plan: 55 y.o. female with plantar nodule right foot. Plan for watchful waiting.  Lung cancer history: Watchful waiting.  Return for well visit in the near future  Tdap given prior to discharge.    No orders of the defined types were placed in this encounter.   Discussed warning signs or symptoms. Please see discharge instructions. Patient expresses understanding.

## 2016-01-15 ENCOUNTER — Encounter: Payer: Self-pay | Admitting: Family Medicine

## 2016-01-23 ENCOUNTER — Other Ambulatory Visit: Payer: Self-pay | Admitting: Family Medicine

## 2016-02-12 ENCOUNTER — Encounter: Payer: Self-pay | Admitting: Family Medicine

## 2016-02-12 ENCOUNTER — Other Ambulatory Visit: Payer: Self-pay | Admitting: Family Medicine

## 2016-02-13 ENCOUNTER — Telehealth: Payer: Self-pay

## 2016-02-13 MED ORDER — CLONAZEPAM 0.5 MG PO TABS: 0.5000 mg | ORAL_TABLET | Freq: Two times a day (BID) | ORAL | 0 refills | Status: DC | PRN

## 2016-02-13 NOTE — Telephone Encounter (Signed)
Yvonne Gonzales called for a refill on her clonazepam. I advised her there was already a prescription at [facility]. She called and they are sending her a 90 day supply. The problem is she will not receive the medication until Friday. She will need 9 tablets sent to the local pharmacy. [facility].   Per Dr Georgina Snell it is ok to give a prescription of 9 tablets.

## 2016-02-13 NOTE — Telephone Encounter (Signed)
Patient is aware 

## 2016-02-16 ENCOUNTER — Ambulatory Visit (INDEPENDENT_AMBULATORY_CARE_PROVIDER_SITE_OTHER): Payer: 59 | Admitting: Obstetrics & Gynecology

## 2016-02-16 ENCOUNTER — Encounter: Payer: Self-pay | Admitting: Obstetrics & Gynecology

## 2016-02-16 VITALS — BP 134/88 | HR 107 | Wt 189.0 lb

## 2016-02-16 DIAGNOSIS — N950 Postmenopausal bleeding: Secondary | ICD-10-CM

## 2016-02-16 MED ORDER — MISOPROSTOL 200 MCG PO TABS: ORAL_TABLET | ORAL | 0 refills | Status: DC

## 2016-02-16 NOTE — Progress Notes (Signed)
   Subjective:    Patient ID: Yvonne Gonzales, female    DOB: June 11, 1960, 55 y.o.   MRN: [id number]  HPI 55 yo MW lung cancer survivor is here because she has had PMB for the last 4 weeks. She had this issue in 1/16 and had a negative embx. She was told that if this were to recurr, she would need a d&c/H/S.   Review of Systems She is on disability.    Objective:   Physical Exam [id number] Breathing, conversing, and ambulating normally Abd- benign       Assessment & Plan:  PMB- plan for d&c and hysteroscopy I sent an email to [place] to schedule this in the near future.

## 2016-02-16 NOTE — Progress Notes (Signed)
   Subjective:    Patient ID: Yvonne Gonzales, female    DOB: [birth year], 55 y.o.   MRN: [id number]  HPI 55 yo MW lung cancer survivor here because she started with PMB again. She had a negative EMBX on 1/16 and started with the PMB again about 4 weeks ago, spotting since then.   Review of Systems     Objective:   Physical Exam        Assessment & Plan:  PMB- plan for d&c and EMBX

## 2016-02-20 ENCOUNTER — Encounter (HOSPITAL_COMMUNITY): Payer: Self-pay | Admitting: *Deleted

## 2016-02-27 LAB — HM COLONOSCOPY

## 2016-03-12 ENCOUNTER — Encounter: Payer: Self-pay | Admitting: Family Medicine

## 2016-03-12 DIAGNOSIS — K644 Residual hemorrhoidal skin tags: Secondary | ICD-10-CM

## 2016-03-12 DIAGNOSIS — K130 Diseases of lips: Secondary | ICD-10-CM

## 2016-03-12 MED ORDER — TRIAMCINOLONE ACETONIDE 0.1 % MT PSTE: PASTE | OROMUCOSAL | 12 refills | Status: DC

## 2016-03-12 MED ORDER — HYDROCORTISONE ACETATE 25 MG RE SUPP: 25.0000 mg | Freq: Two times a day (BID) | RECTAL | 11 refills | Status: DC

## 2016-03-12 NOTE — Patient Instructions (Addendum)
Your procedure is scheduled on:  Wednesday, Nov. 1, 2017  Enter through the [employer] of [facility] at: 12:00 [place] up the phone at the desk and dial [phone].  Call this number if you have problems the morning of surgery: [phone].  Remember: Do NOT eat food:  After Midnight Tuesday, Oct. 31, 2017  Do NOT drink clear liquids after:  9:30 AM day of surgery  Take these medicines the morning of surgery with a SIP OF WATER:  Escitalopram, Ritalin, Morphine, Clonazepam if needed  Stop ALL herbal medications at this time   Do NOT wear jewelry (body piercing), metal hair clips/bobby pins, make-up, or nail polish. Do NOT wear lotions, powders, or perfumes.  You may wear deodorant. Do NOT shave for 48 hours prior to surgery. Do NOT bring valuables to the hospital. Contacts, dentures, or bridgework may not be worn into surgery.  Have a responsible adult drive you home and stay with you for 24 hours after your procedure

## 2016-03-15 ENCOUNTER — Encounter (HOSPITAL_COMMUNITY)
Admission: RE | Admit: 2016-03-15 | Discharge: 2016-03-15 | Disposition: A | Discharge status: Home / Self Care | Payer: 59 | Source: Ambulatory Visit | Attending: Obstetrics & Gynecology | Admitting: Obstetrics & Gynecology

## 2016-03-15 ENCOUNTER — Encounter (HOSPITAL_COMMUNITY): Payer: Self-pay

## 2016-03-15 DIAGNOSIS — Z01818 Encounter for other preprocedural examination: Secondary | ICD-10-CM | POA: Diagnosis not present

## 2016-03-15 HISTORY — DX: Other complications of anesthesia, initial encounter: T88.59XA

## 2016-03-15 HISTORY — DX: Acute pancreatitis without necrosis or infection, unspecified: K85.90

## 2016-03-15 HISTORY — DX: Gastro-esophageal reflux disease without esophagitis: K21.9

## 2016-03-15 HISTORY — DX: Other congenital malformations of pancreas and pancreatic duct: Q45.3

## 2016-03-15 HISTORY — DX: Adverse effect of unspecified anesthetic, initial encounter: T41.45XA

## 2016-03-15 HISTORY — DX: Other amnesia: R41.3

## 2016-03-15 HISTORY — DX: Dyspnea, unspecified: R06.00

## 2016-03-15 HISTORY — DX: Polyp of colon: K63.5

## 2016-03-15 HISTORY — DX: Toxic encephalopathy: G92

## 2016-03-15 HISTORY — DX: Unspecified osteoarthritis, unspecified site: M19.90

## 2016-03-15 HISTORY — DX: Neuralgia and neuritis, unspecified: M79.2

## 2016-03-15 HISTORY — DX: Narcolepsy without cataplexy: G47.419

## 2016-03-15 HISTORY — DX: Unspecified toxic encephalopathy: G92.9

## 2016-03-15 LAB — CBC
HEMATOCRIT: 41 % (ref 36.0–46.0)
HEMOGLOBIN: 14.1 g/dL (ref 12.0–15.0)
MCH: 29.5 pg (ref 26.0–34.0)
MCHC: 34.4 g/dL (ref 30.0–36.0)
MCV: 85.8 fL (ref 78.0–100.0)
Platelets: 266 10*3/uL (ref 150–400)
RBC: 4.78 MIL/uL (ref 3.87–5.11)
RDW: 12.6 % (ref 11.5–15.5)
WBC: 6.3 10*3/uL (ref 4.0–10.5)

## 2016-03-23 MED ORDER — DOXYCYCLINE HYCLATE 100 MG IV SOLR
200.0000 mg | INTRAVENOUS | Status: DC
  Filled 2016-03-23: qty 200

## 2016-03-24 ENCOUNTER — Ambulatory Visit (HOSPITAL_COMMUNITY): Payer: 59 | Admitting: Anesthesiology

## 2016-03-24 ENCOUNTER — Encounter (HOSPITAL_COMMUNITY): Payer: Self-pay

## 2016-03-24 ENCOUNTER — Ambulatory Visit (HOSPITAL_COMMUNITY)
Admission: RE | Admit: 2016-03-24 | Discharge: 2016-03-24 | Disposition: A | Discharge status: Home / Self Care | Payer: 59 | Source: Ambulatory Visit | Attending: Obstetrics & Gynecology | Admitting: Obstetrics & Gynecology

## 2016-03-24 ENCOUNTER — Encounter (HOSPITAL_COMMUNITY)
Admission: RE | Disposition: A | Discharge status: Home / Self Care | Payer: Self-pay | Source: Ambulatory Visit | Attending: Obstetrics & Gynecology

## 2016-03-24 DIAGNOSIS — K219 Gastro-esophageal reflux disease without esophagitis: Secondary | ICD-10-CM | POA: Insufficient documentation

## 2016-03-24 DIAGNOSIS — E785 Hyperlipidemia, unspecified: Secondary | ICD-10-CM | POA: Diagnosis not present

## 2016-03-24 DIAGNOSIS — Z87891 Personal history of nicotine dependence: Secondary | ICD-10-CM | POA: Insufficient documentation

## 2016-03-24 DIAGNOSIS — N950 Postmenopausal bleeding: Secondary | ICD-10-CM | POA: Diagnosis not present

## 2016-03-24 HISTORY — PX: HYSTEROSCOPY WITH D & C: SHX1775

## 2016-03-24 SURGERY — DILATATION AND CURETTAGE /HYSTEROSCOPY
Anesthesia: General | Site: Vagina

## 2016-03-24 MED ORDER — SILVER NITRATE-POT NITRATE 75-25 % EX MISC
CUTANEOUS | Status: AC
  Filled 2016-03-24: qty 3

## 2016-03-24 MED ORDER — MIDAZOLAM HCL 2 MG/2ML IJ SOLN
INTRAMUSCULAR | Status: AC
  Filled 2016-03-24: qty 2

## 2016-03-24 MED ORDER — SODIUM CHLORIDE 0.9 % IR SOLN
Status: DC | PRN
  Administered 2016-03-24: 3000 mL

## 2016-03-24 MED ORDER — FENTANYL CITRATE (PF) 100 MCG/2ML IJ SOLN
INTRAMUSCULAR | Status: DC | PRN
  Administered 2016-03-24 (×2): 50 ug via INTRAVENOUS

## 2016-03-24 MED ORDER — BUPIVACAINE HCL (PF) 0.5 % IJ SOLN
INTRAMUSCULAR | Status: DC | PRN
Start: 2016-03-24 — End: 2016-03-24
  Administered 2016-03-24: 10 mL

## 2016-03-24 MED ORDER — ACETAMINOPHEN 160 MG/5ML PO SOLN
ORAL | Status: AC
  Filled 2016-03-24: qty 20.3

## 2016-03-24 MED ORDER — SCOPOLAMINE 1 MG/3DAYS TD PT72
1.0000 | MEDICATED_PATCH | Freq: Once | TRANSDERMAL | Status: DC
  Administered 2016-03-24: 1.5 mg via TRANSDERMAL

## 2016-03-24 MED ORDER — ONDANSETRON HCL 4 MG/2ML IJ SOLN
INTRAMUSCULAR | Status: DC | PRN
  Administered 2016-03-24: 4 mg via INTRAVENOUS

## 2016-03-24 MED ORDER — DEXAMETHASONE SODIUM PHOSPHATE 10 MG/ML IJ SOLN
INTRAMUSCULAR | Status: DC | PRN
  Administered 2016-03-24: 4 mg via INTRAVENOUS

## 2016-03-24 MED ORDER — SCOPOLAMINE 1 MG/3DAYS TD PT72
MEDICATED_PATCH | TRANSDERMAL | Status: AC
  Administered 2016-03-24: 1.5 mg via TRANSDERMAL
  Filled 2016-03-24: qty 1

## 2016-03-24 MED ORDER — ACETAMINOPHEN 160 MG/5ML PO SOLN
975.0000 mg | Freq: Once | ORAL | Status: AC
  Administered 2016-03-24: 975 mg via ORAL

## 2016-03-24 MED ORDER — ONDANSETRON HCL 4 MG/2ML IJ SOLN
INTRAMUSCULAR | Status: AC
  Filled 2016-03-24: qty 2

## 2016-03-24 MED ORDER — KETOROLAC TROMETHAMINE 30 MG/ML IJ SOLN
INTRAMUSCULAR | Status: AC
  Filled 2016-03-24: qty 1

## 2016-03-24 MED ORDER — ACETAMINOPHEN 500 MG PO TABS: 500.0000 mg | ORAL_TABLET | Freq: Four times a day (QID) | ORAL | 0 refills | Status: DC | PRN

## 2016-03-24 MED ORDER — PROPOFOL 10 MG/ML IV BOLUS
INTRAVENOUS | Status: DC | PRN
  Administered 2016-03-24: 180 mg via INTRAVENOUS

## 2016-03-24 MED ORDER — PROPOFOL 10 MG/ML IV BOLUS
INTRAVENOUS | Status: AC
  Filled 2016-03-24: qty 20

## 2016-03-24 MED ORDER — DOXYCYCLINE HYCLATE 100 MG IV SOLR
200.0000 mg | INTRAVENOUS | Status: AC
  Administered 2016-03-24: 200 mg via INTRAVENOUS
  Filled 2016-03-24: qty 200

## 2016-03-24 MED ORDER — LACTATED RINGERS IV SOLN
INTRAVENOUS | Status: DC
  Administered 2016-03-24: 125 mL/h via INTRAVENOUS

## 2016-03-24 MED ORDER — LIDOCAINE HCL (CARDIAC) 20 MG/ML IV SOLN
INTRAVENOUS | Status: AC
  Filled 2016-03-24: qty 5

## 2016-03-24 MED ORDER — MIDAZOLAM HCL 2 MG/2ML IJ SOLN
INTRAMUSCULAR | Status: DC | PRN
  Administered 2016-03-24: 2 mg via INTRAVENOUS

## 2016-03-24 MED ORDER — FENTANYL CITRATE (PF) 100 MCG/2ML IJ SOLN: 25.0000 ug | INTRAMUSCULAR | Status: DC | PRN

## 2016-03-24 MED ORDER — FENTANYL CITRATE (PF) 250 MCG/5ML IJ SOLN
INTRAMUSCULAR | Status: AC
  Filled 2016-03-24: qty 5

## 2016-03-24 MED ORDER — BUPIVACAINE HCL (PF) 0.5 % IJ SOLN
INTRAMUSCULAR | Status: AC
  Filled 2016-03-24: qty 30

## 2016-03-24 MED ORDER — DEXAMETHASONE SODIUM PHOSPHATE 4 MG/ML IJ SOLN
INTRAMUSCULAR | Status: AC
  Filled 2016-03-24: qty 1

## 2016-03-24 MED ORDER — LIDOCAINE HCL (CARDIAC) 20 MG/ML IV SOLN
INTRAVENOUS | Status: DC | PRN
  Administered 2016-03-24: 50 mg via INTRAVENOUS

## 2016-03-24 SURGICAL SUPPLY — 19 items
BIPOLAR CUTTING LOOP 21FR (ELECTRODE)
CANISTER SUCT 3000ML (MISCELLANEOUS) ×2 IMPLANT
CATH ROBINSON RED A/P 16FR (CATHETERS) ×2 IMPLANT
CLOTH BEACON ORANGE TIMEOUT ST (SAFETY) ×2 IMPLANT
CONTAINER PREFILL 10% NBF 60ML (FORM) ×2 IMPLANT
DILATOR CANAL MILEX (MISCELLANEOUS) IMPLANT
ELECT REM PT RETURN 9FT ADLT (ELECTROSURGICAL)
ELECTRODE REM PT RTRN 9FT ADLT (ELECTROSURGICAL) IMPLANT
GLOVE BIO SURGEON STRL SZ 6.5 (GLOVE) ×2 IMPLANT
GLOVE BIOGEL PI IND STRL 7.0 (GLOVE) ×1 IMPLANT
GLOVE BIOGEL PI INDICATOR 7.0 (GLOVE) ×1
GOWN STRL REUS W/TWL LRG LVL3 (GOWN DISPOSABLE) ×4 IMPLANT
LOOP CUTTING BIPOLAR 21FR (ELECTRODE) IMPLANT
PACK VAGINAL MINOR WOMEN LF (CUSTOM PROCEDURE TRAY) ×2 IMPLANT
PAD OB MATERNITY 4.3X12.25 (PERSONAL CARE ITEMS) ×2 IMPLANT
TOWEL OR 17X24 6PK STRL BLUE (TOWEL DISPOSABLE) ×4 IMPLANT
TUBING AQUILEX INFLOW (TUBING) ×2 IMPLANT
TUBING AQUILEX OUTFLOW (TUBING) ×2 IMPLANT
WATER STERILE IRR 1000ML POUR (IV SOLUTION) ×2 IMPLANT

## 2016-03-24 NOTE — Discharge Instructions (Signed)
Dilation and Curettage or Vacuum Curettage, Care After Refer to this sheet in the next few weeks. These instructions provide you with information on caring for yourself after your procedure. Your health care provider may also give you more specific instructions. Your treatment has been planned according to current medical practices, but problems sometimes occur. Call your health care provider if you have any problems or questions after your procedure. WHAT TO EXPECT AFTER THE PROCEDURE After your procedure, it is typical to have light cramping and bleeding. This may last for 2 days to 2 weeks after the procedure. HOME CARE INSTRUCTIONS    Do not drive for 24 hours.  May remove Scop patch on or before 03/27/2016.  Wait 1 week before returning to strenuous activities.  Take your temperature 2 times a day for 4 days and write it down. Provide these temperatures to your health care provider if you develop a fever.  Avoid long periods of standing.  Avoid heavy lifting, pushing, or pulling. Do not lift anything heavier than 10 pounds (4.5 kg).  Limit stair climbing to once or twice a day.  Take rest periods often.  You may resume your usual diet.  Drink enough fluids to keep your urine clear or pale yellow.  Your usual bowel function should return. If you have constipation, you may:  Take a mild laxative with permission from your health care provider.  Add fruit and bran to your diet.  Drink more fluids.  Take showers instead of baths until your health care provider gives you permission to take baths.  Do not go swimming or use a hot tub until your health care provider approves.  Try to have someone with you or available to you the first 24-48 hours, especially if you were given a general anesthetic.  Do not douche, use tampons, or have sex (intercourse) for 2 weeks after the procedure.  Only take over-the-counter or prescription medicines as directed by your health care provider.  Do not take aspirin. It can cause bleeding.  Follow up with your health care provider as directed. SEEK MEDICAL CARE IF:   You have increasing cramps or pain that is not relieved with medicine.  You have abdominal pain that does not seem to be related to the same area of earlier cramping and pain.  You have bad smelling vaginal discharge.  You have a rash.  You are having problems with any medicine. SEEK IMMEDIATE MEDICAL CARE IF:   You have bleeding that is heavier than a normal menstrual period.  You have a fever.  You have chest pain.  You have shortness of breath.  You feel dizzy or feel like fainting.  You pass out.  You have pain in your shoulder strap area.  You have heavy vaginal bleeding with or without blood clots. MAKE SURE YOU:   Understand these instructions.  Will watch your condition.  Will get help right away if you are not doing well or get worse.   This information is not intended to replace advice given to you by your health care provider. Make sure you discuss any questions you have with your health care provider.   Document Released: 05/07/2000 Document Revised: 05/15/2013 Document Reviewed: 12/07/2012 Elsevier Interactive Patient Education [employer].

## 2016-03-24 NOTE — Anesthesia Preprocedure Evaluation (Signed)
Anesthesia Evaluation  Patient identified by MRN, date of birth, ID band Patient awake    Reviewed: Allergy & Precautions, H&P , Patient's Chart, lab work & pertinent test results, reviewed documented beta blocker date and time   Airway Mallampati: II  TM Distance: >3 FB Neck ROM: full    Dental no notable dental hx.    Pulmonary former smoker,    Pulmonary exam normal breath sounds clear to auscultation       Cardiovascular  Rhythm:regular Rate:Normal     Neuro/Psych    GI/Hepatic   Endo/Other    Renal/GU      Musculoskeletal   Abdominal   Peds  Hematology   Anesthesia Other Findings Chronic opiate use   Reproductive/Obstetrics                             Anesthesia Physical Anesthesia Plan  ASA: II  Anesthesia Plan:    Post-op Pain Management:    Induction: Intravenous  Airway Management Planned: LMA  Additional Equipment:   Intra-op Plan:   Post-operative Plan:   Informed Consent: I have reviewed the patients History and Physical, chart, labs and discussed the procedure including the risks, benefits and alternatives for the proposed anesthesia with the patient or authorized representative who has indicated his/her understanding and acceptance.   Dental Advisory Given and Dental advisory given  Plan Discussed with: CRNA and Surgeon  Anesthesia Plan Comments: (Discussed GA with LMA, possible sore throat, potential need to switch to ETT, N/V, pulmonary aspiration. Questions answered. )        Anesthesia Quick Evaluation

## 2016-03-24 NOTE — H&P (Signed)
Yvonne Gonzales is an 55 y.o. female  MW lung cancer survivor is here because she has had PMB for the last 4 weeks. She had this issue in 1/16 and had a negative embx. She was told that if this were to recurr, she would need a d&c/H/S.  No LMP recorded. Patient is postmenopausal.    Past Medical History:  Diagnosis Date  . Acute pancreatitis   . Arthritis   . Cancer ([facility]) Lung (right side)  . Chronic shoulder pain 07/25/2013   [facility] since cancer treatment.   . Colon polyps   . Complication of anesthesia    prolonged sedation  . Depression 07/25/2013  . Dyspnea    with exertion  . Generalized anxiety disorder 07/25/2013  . GERD (gastroesophageal reflux disease)   . Hyperlipidemia 07/25/2013  . Memory loss   . Mild cognitive impairment 07/25/2013   [facility] Neurology - thought to be secondary to cancer treatment   . Narcolepsy   . Nerve pain    right shoulder, side  . Pancreatic rests in stomach   . Toxic encephalopathy     Past Surgical History:  Procedure Laterality Date  . CHOLECYSTECTOMY    . COLONOSCOPY    . LUNG CANCER SURGERY Right    pancost tumor  . LUNG REMOVAL, PARTIAL Right   . lymphectomy    . ribs removed, right side     chest wall removal  . TONSILLECTOMY    . TUBAL LIGATION    . UPPER ESOPHAGEAL ENDOSCOPIC ULTRASOUND (EUS)      Family History  Problem Relation Age of Onset  . Cancer Mother     breast  . Cancer Father     lung,brain,bone    Social History:  reports that she quit smoking about 8 years ago. She has never used smokeless tobacco. She reports that she drinks alcohol. She reports that she does not use drugs.  Allergies:  Allergies  Allergen Reactions  . Latex Hives    Red rash  . Methylphenidate Itching    At '10mg'$    . Amphetamine-Dextroamphetamine     HA  . Aricept [Donepezil Hcl]     Sleep paralysis  . Diclofenac Sodium     Voltaren  . Nsaids Nausea And Vomiting    High doses only  . Tape   .  Tolmetin Nausea And Vomiting    NSAID    Prescriptions Prior to Admission  Medication Sig Dispense Refill Last Dose  . calcium carbonate (TUMS - DOSED IN MG ELEMENTAL CALCIUM) 500 MG chewable tablet Chew 1 tablet by mouth as needed for indigestion or heartburn.     . Calcium-Phosphorus-Vitamin D 100-50-100 MG-MG-UNIT CHEW Chew by mouth.   Taking  . cholecalciferol (VITAMIN D) 1000 UNITS tablet Take 1,000 Units by mouth daily.   Taking  . clonazePAM (KLONOPIN) 0.5 MG tablet Take 1 tablet (0.5 mg total) by mouth 2 (two) times daily as needed. for anxiety. (Patient taking differently: Take 0.5 mg by mouth 2 (two) times daily as needed for anxiety. for anxiety.) 9 tablet 0 Taking  . escitalopram (LEXAPRO) 10 MG tablet Take 1 tablet (10 mg total) by mouth daily. 90 tablet 2 Taking  . hydrocortisone (ANUSOL-HC) 25 MG suppository Place 1 suppository (25 mg total) rectally 2 (two) times daily. Only PRN rectal irritation. 12 suppository 11   . Krill Oil 300 MG CAPS Take 1 capsule by mouth every morning.    Taking  . Magnesium 100 MG  CAPS Take 500 mg by mouth every morning.    Taking  . methylphenidate (RITALIN) 20 MG tablet Take 20 mg by mouth 2 (two) times daily.   Taking  . misoprostol (CYTOTEC) 200 MCG tablet Take 3 pills by mouth the night before d&c. 3 tablet 0   . morphine (MS CONTIN) 15 MG 12 hr tablet Take 15 mg by mouth daily as needed for pain.    Taking  . morphine (MS CONTIN) 30 MG 12 hr tablet Take 30 mg by mouth every 12 (twelve) hours.    Taking  . Multiple Vitamin (MULTIVITAMIN) tablet Take 1 tablet by mouth daily.   Taking  . naloxegol oxalate (MOVANTIK) 25 MG TABS tablet Take 1 tablet (25 mg total) by mouth daily. 30 tablet 11 Taking  . Probiotic Product (PROBIOTIC DAILY PO) Take 1 tablet by mouth every morning.    Taking  . triamcinolone (KENALOG) 0.1 % paste Apply to corners of mouth BID as needed. 5 g 12   . zolpidem (AMBIEN) 10 MG tablet Take 1 tablet (10 mg total) by mouth at  bedtime as needed for sleep. 90 tablet 0 Taking    ROS  She is on disablility  There were no vitals taken for this visit. Physical Exam  Heart- RRR Lungs- CTAB Abd- benign, obese  No results found for this or any previous visit (from the past 24 hour(s)).  No results found.  Assessment/Plan: PMB- plan for h/s and d&c  She understands the risks of surgery, including, but not to infection, bleeding, DVTs, damage to bowel, bladder, ureters. She wishes to proceed.     Emily Filbert 03/24/2016, 12:03 PM

## 2016-03-24 NOTE — Anesthesia Postprocedure Evaluation (Signed)
Anesthesia Post Note  Patient: Yvonne Gonzales  Procedure(s) Performed: Procedure(s) (LRB): DILATATION AND CURETTAGE /HYSTEROSCOPY (N/A)  Patient location during evaluation: PACU Anesthesia Type: General Level of consciousness: sedated Pain management: satisfactory to patient Vital Signs Assessment: post-procedure vital signs reviewed and stable Respiratory status: spontaneous breathing Cardiovascular status: stable Anesthetic complications: no     Last Vitals:  Vitals:   03/24/16 1530 03/24/16 1600  BP: 95/60 107/72  Pulse: 79 85  Resp: 16 16  Temp: 36.9 C 36.4 C    Last Pain:  Vitals:   03/24/16 1235  TempSrc: Oral  PainSc: 2    Pain Goal: Patients Stated Pain Goal: 2 (03/24/16 1235)               [name] EDWARD

## 2016-03-24 NOTE — Transfer of Care (Signed)
Immediate Anesthesia Transfer of Care Note  Patient: Yvonne Gonzales  Procedure(s) Performed: Procedure(s): DILATATION AND CURETTAGE /HYSTEROSCOPY (N/A)  Patient Location: PACU  Anesthesia Type:General  Level of Consciousness: alert , oriented and sedated  Airway & Oxygen Therapy: Patient Spontanous Breathing and Patient connected to nasal cannula oxygen  Post-op Assessment: Report given to RN and Post -op Vital signs reviewed and stable  Post vital signs: Reviewed and stable  Last Vitals:  Vitals:   03/24/16 1235  BP: (!) 113/52  Pulse: 89  Resp: 20  Temp: 37 C    Last Pain:  Vitals:   03/24/16 1235  TempSrc: Oral  PainSc: 2       Patients Stated Pain Goal: 2 (03/52/48 1859)  Complications: No apparent anesthesia complications

## 2016-03-24 NOTE — Anesthesia Procedure Notes (Signed)
Procedure Name: LMA Insertion Date/Time: 03/24/2016 1:52 PM Performed by: Bufford Spikes Pre-anesthesia Checklist: Patient identified, Emergency Drugs available, Suction available, Patient being monitored and Timeout performed Patient Re-evaluated:Patient Re-evaluated prior to inductionOxygen Delivery Method: Circle system utilized Preoxygenation: Pre-oxygenation with 100% oxygen Intubation Type: IV induction Ventilation: Mask ventilation without difficulty LMA: LMA inserted LMA Size: 4.0 Tube type: Oral Number of attempts: 1 Placement Confirmation: ETT inserted through vocal cords under direct vision,  breath sounds checked- equal and bilateral and positive ETCO2 Dental Injury: Teeth and Oropharynx as per pre-operative assessment

## 2016-03-24 NOTE — Op Note (Signed)
03/24/2016  2:13 PM  PATIENT:  Yvonne Gonzales  55 y.o. female  PRE-OPERATIVE DIAGNOSIS:  cpt [phone] -  Post menopausal bleeding  POST-OPERATIVE DIAGNOSIS:  Post menopausal bleeding  PROCEDURE:  Procedure(s): DILATATION AND CURETTAGE /HYSTEROSCOPY (N/A)  SURGEON:  Surgeon(s) and Role:    * Emily Filbert, MD - Primary  ANESTHESIA:   general  EBL:  Total I/O In: -  Out: 65 [Urine:60; Blood:10]  BLOOD ADMINISTERED:none  DRAINS: none   LOCAL MEDICATIONS USED:  MARCAINE     SPECIMEN:  Source of Specimen:  uterine curettings  DISPOSITION OF SPECIMEN:  PATHOLOGY  COUNTS:  YES  TOURNIQUET:  * No tourniquets in log *  DICTATION: .Dragon Dictation  PLAN OF CARE: Discharge to home after PACU  PATIENT DISPOSITION:  PACU - hemodynamically stable.   Delay start of Pharmacological VTE agent (>24hrs) due to surgical blood loss or risk of bleeding: not applicable    The risks, benefits, and alternatives of surgery were explained, understood, and accepted. All questions were answered. Consents were signed. In the operating room MAC anesthesia was applied without complication, and she was placed in the dorsal lithotomy position. Her vagina was prepped and draped in the usual sterile fashion. A Robinson catheter was used to drain her bladder. A bimanual exam revealed a normal size and shape retroverted uterus. Her adnexa were nonenlarged. A speculum was placed and a single-tooth tenaculum was used to grasp the anterior lip of her cervix. A total of 10 mL of 0.5% Marcaine was used to perform a paracervical block. Her uterus sounded to 9 cm. Her cervix was carefully and slowly dilated to accommodate a small curette. A curettage was done in all quadrants and the fundus of the uterus. A moderate amount of polypoid-type  tissue was obtained. A gritty sensation was appreciated throughout. There was no bleeding noted at the end of the case. She was taken to the recovery room after being extubated. She  tolerated the procedure well.

## 2016-03-25 ENCOUNTER — Encounter (HOSPITAL_COMMUNITY): Payer: Self-pay | Admitting: Obstetrics & Gynecology

## 2016-04-13 ENCOUNTER — Other Ambulatory Visit: Payer: Self-pay | Admitting: Family Medicine

## 2016-04-13 ENCOUNTER — Other Ambulatory Visit: Payer: Self-pay | Admitting: Medical Oncology

## 2016-04-13 DIAGNOSIS — N63 Unspecified lump in unspecified breast: Secondary | ICD-10-CM

## 2016-05-04 ENCOUNTER — Ambulatory Visit: Payer: 59 | Admitting: Obstetrics & Gynecology

## 2016-05-04 VITALS — BP 141/87 | HR 111 | Wt 186.0 lb

## 2016-05-04 DIAGNOSIS — Z9889 Other specified postprocedural states: Secondary | ICD-10-CM

## 2016-05-04 NOTE — Progress Notes (Signed)
   Subjective:    Patient ID: Yvonne Gonzales, female    DOB: May 18, 1961, 55 y.o.   MRN: [id number]  HPI 55 yo MW lady here for her 6 week post op visit after a d&c/h/s for PMB recurrence. Her pathology was normal (benign polyp). She is having no problems.   Review of Systems     Objective:   Physical Exam  WNWHWFNAD Breathing, conversing, and ambulating normally      Assessment & Plan:  PMB- s/p d&c/H/s- doing well, NED Reassurance given RTC for annual/pap 1 year Declines flu vaccine

## 2016-05-06 ENCOUNTER — Ambulatory Visit
Admission: RE | Admit: 2016-05-06 | Discharge: 2016-05-06 | Disposition: A | Discharge status: Home / Self Care | Payer: 59 | Source: Ambulatory Visit | Attending: Medical Oncology | Admitting: Medical Oncology

## 2016-05-06 DIAGNOSIS — N63 Unspecified lump in unspecified breast: Secondary | ICD-10-CM

## 2016-05-09 ENCOUNTER — Other Ambulatory Visit: Payer: Self-pay | Admitting: Family Medicine

## 2016-05-14 ENCOUNTER — Other Ambulatory Visit: Payer: Self-pay

## 2016-05-14 ENCOUNTER — Encounter: Payer: Self-pay | Admitting: Family Medicine

## 2016-05-14 MED ORDER — CLONAZEPAM 0.5 MG PO TABS: 0.5000 mg | ORAL_TABLET | Freq: Two times a day (BID) | ORAL | 0 refills | Status: DC | PRN

## 2016-05-27 ENCOUNTER — Encounter: Payer: 59 | Admitting: Family Medicine

## 2016-05-31 ENCOUNTER — Encounter: Payer: Self-pay | Admitting: Family Medicine

## 2016-05-31 ENCOUNTER — Ambulatory Visit (INDEPENDENT_AMBULATORY_CARE_PROVIDER_SITE_OTHER): Payer: 59 | Admitting: Family Medicine

## 2016-05-31 VITALS — BP 105/75 | HR 100 | Wt 187.0 lb

## 2016-05-31 DIAGNOSIS — G47419 Narcolepsy without cataplexy: Secondary | ICD-10-CM

## 2016-05-31 DIAGNOSIS — Z0000 Encounter for general adult medical examination without abnormal findings: Secondary | ICD-10-CM

## 2016-05-31 DIAGNOSIS — G92 Toxic encephalopathy: Secondary | ICD-10-CM

## 2016-05-31 DIAGNOSIS — M8580 Other specified disorders of bone density and structure, unspecified site: Secondary | ICD-10-CM

## 2016-05-31 DIAGNOSIS — Z780 Asymptomatic menopausal state: Secondary | ICD-10-CM

## 2016-05-31 DIAGNOSIS — F329 Major depressive disorder, single episode, unspecified: Secondary | ICD-10-CM

## 2016-05-31 DIAGNOSIS — F411 Generalized anxiety disorder: Secondary | ICD-10-CM

## 2016-05-31 DIAGNOSIS — G929 Unspecified toxic encephalopathy: Secondary | ICD-10-CM

## 2016-05-31 DIAGNOSIS — G4700 Insomnia, unspecified: Secondary | ICD-10-CM

## 2016-05-31 DIAGNOSIS — F32A Depression, unspecified: Secondary | ICD-10-CM

## 2016-05-31 MED ORDER — ZOLPIDEM TARTRATE 10 MG PO TABS: 10.0000 mg | ORAL_TABLET | Freq: Every evening | ORAL | 1 refills | Status: DC | PRN

## 2016-05-31 MED ORDER — ESCITALOPRAM OXALATE 10 MG PO TABS: 10.0000 mg | ORAL_TABLET | Freq: Every day | ORAL | 2 refills | Status: DC

## 2016-05-31 NOTE — Patient Instructions (Signed)
Thank you for coming in today. Get labs at [facility] in the near future.   [place] [address], [place] [id number]  Return in 6-12 months.

## 2016-05-31 NOTE — Progress Notes (Signed)
Yvonne Gonzales is a 56 y.o. female who presents to [facility]: [place] today for routine physical exam.  1. Depression/GAD/insomnia: She feels like these are well-controlled with lexapro, clonezepam, and ambien.  2. Plantar fibromatosis: Nodule on right foot remains stable and does not bother patient  3. History of lung CA, s/p resection, chemo, & radiation: No fevers/chills, weight loss, increased SOB from baseline, or hemoptysis.  4. Health maintenance: previously on statin for HLD, determined to not need statins 2 years ago due to low CV risk and normal LDL. No FH of diabetes, polyuria, or polydipsia. Has had HIV screening before but not Hep C. UTD on colonoscopy, mammogram, and pap smears. Osteopenia noted on bone density scan 6 months ago.  Past Medical History:  Diagnosis Date  . Acute pancreatitis   . Arthritis   . Cancer ([place]) Lung (right side)  . Chronic shoulder pain 07/25/2013   [facility] Pain Management since cancer treatment.   . Colon polyps   . Complication of anesthesia    prolonged sedation  . Depression 07/25/2013  . Dyspnea    with exertion  . Generalized anxiety disorder 07/25/2013  . GERD (gastroesophageal reflux disease)   . Hyperlipidemia 07/25/2013  . Memory loss   . Mild cognitive impairment 07/25/2013   [facility] Neurology - thought to be secondary to cancer treatment   . Narcolepsy   . Nerve pain    right shoulder, side  . Pancreatic rests in stomach   . Toxic encephalopathy    Past Surgical History:  Procedure Laterality Date  . CHOLECYSTECTOMY    . COLONOSCOPY    . HYSTEROSCOPY W/D&C N/A 03/24/2016   Procedure: DILATATION AND CURETTAGE /HYSTEROSCOPY;  Surgeon: Emily Filbert, MD;  Location: [facility];  Service: Gynecology;  Laterality: N/A;  . LUNG CANCER SURGERY Right    pancost tumor  . LUNG REMOVAL, PARTIAL Right   . lymphectomy      . ribs removed, right side     chest wall removal  . TONSILLECTOMY    . TUBAL LIGATION    . UPPER ESOPHAGEAL ENDOSCOPIC ULTRASOUND (EUS)     Social History  Substance Use Topics  . Smoking status: Former Smoker    Packs/day: 1.50    Years: 35.00    Quit date: 04/16/2007  . Smokeless tobacco: Never Used  . Alcohol use 0.0 oz/week     Comment: rare   family history includes Cancer in her father and mother.  ROS as above:  Medications: Current Outpatient Prescriptions  Medication Sig Dispense Refill  . acetaminophen (TYLENOL) 500 MG tablet Take 1 tablet (500 mg total) by mouth every 6 (six) hours as needed. 30 tablet 0  . calcium carbonate (TUMS - DOSED IN MG ELEMENTAL CALCIUM) 500 MG chewable tablet Chew 1 tablet by mouth as needed for indigestion or heartburn.    . Calcium-Phosphorus-Vitamin D 100-50-100 MG-MG-UNIT CHEW Chew by mouth.    . cholecalciferol (VITAMIN D) 1000 UNITS tablet Take 5,000 Units by mouth daily.     . clonazePAM (KLONOPIN) 0.5 MG tablet Take 1 tablet (0.5 mg total) by mouth 2 (two) times daily as needed. for anxiety. 180 tablet 0  . escitalopram (LEXAPRO) 10 MG tablet Take 1 tablet (10 mg total) by mouth daily. 90 tablet 2  . hydrocortisone (ANUSOL-HC) 25 MG suppository Place 1 suppository (25 mg total) rectally 2 (two) times daily. Only PRN rectal irritation. 12 suppository 11  .  Krill Oil 300 MG CAPS Take 1 capsule by mouth every morning.     . Magnesium 100 MG CAPS Take 500 mg by mouth every morning.     . methylphenidate (RITALIN) 20 MG tablet Take 20 mg by mouth 2 (two) times daily.    . misoprostol (CYTOTEC) 200 MCG tablet Take 3 pills by mouth the night before d&c. 3 tablet 0  . morphine (MS CONTIN) 15 MG 12 hr tablet Take 15 mg by mouth daily as needed for pain.     Marland Kitchen morphine (MS CONTIN) 30 MG 12 hr tablet Take 30 mg by mouth every 12 (twelve) hours.     . Multiple Vitamin (MULTIVITAMIN) tablet Take 1 tablet by mouth daily.    . naloxegol oxalate  (MOVANTIK) 25 MG TABS tablet Take 1 tablet (25 mg total) by mouth daily. 30 tablet 11  . Probiotic Product (PROBIOTIC DAILY PO) Take 1 tablet by mouth every morning.     . triamcinolone (KENALOG) 0.1 % paste Apply to corners of mouth BID as needed. 5 g 12  . zolpidem (AMBIEN) 10 MG tablet Take 1 tablet (10 mg total) by mouth at bedtime as needed for sleep. 90 tablet 1   No current facility-administered medications for this visit.    Facility-Administered Medications Ordered in Other Visits  Medication Dose Route Frequency Provider Last Rate Last Dose  . iopamidol (ISOVUE-370) 76 % injection 100 mL  100 mL Intravenous Once PRN Marcial Pacas, DO       Allergies  Allergen Reactions  . Latex Hives    Red rash  . Methylphenidate Itching    At '10mg'$    . Amphetamine-Dextroamphetamine     HA  . Aricept [Donepezil Hcl]     Sleep paralysis  . Diclofenac Sodium     Voltaren  . Nsaids Nausea And Vomiting    High doses only  . Tape   . Tolmetin Nausea And Vomiting    NSAID    Health Maintenance Health Maintenance  Topic Date Due  . Hepatitis C Screening  10-21-1960  . HIV Screening  05/20/1976  . INFLUENZA VACCINE  12/23/2015  . PAP SMEAR  05/15/2017  . MAMMOGRAM  11/04/2017  . COLONOSCOPY  02/26/2021  . TETANUS/TDAP  12/29/2025     Exam:  BP 105/75   Pulse 100   Wt 187 lb (84.8 kg)   BMI 31.12 kg/m  Gen: Well NAD HEENT: EOMI,  MMM Lungs: Normal work of breathing. CTABL Heart: RRR no MRG Abd: NABS, Soft. Nondistended, Nontender Exts: Brisk capillary refill, warm and well perfused.    GAD 7 : Generalized Anxiety Score 05/31/2016  Nervous, Anxious, on Edge 2  Control/stop worrying 0  Worry too much - different things 0  Trouble relaxing 0  Restless 0  Easily annoyed or irritable 1  Afraid - awful might happen 0  Total GAD 7 Score 3    Depression screen PHQ 2/9 05/31/2016  Decreased Interest 0  Down, Depressed, Hopeless 1  PHQ - 2 Score 1  Altered sleeping 0  Tired,  decreased energy 1  Change in appetite 0  Feeling bad or failure about yourself  0  Trouble concentrating 1  Moving slowly or fidgety/restless 0  Suicidal thoughts 0  PHQ-9 Score 3     No results found for this or any previous visit (from the past 72 hour(s)). No results found.    Assessment and Plan: 56 y.o. female with:  1. Depression/GAD/insomnia: Doing well; continue lexapro,  clonezepam, and ambien.  2. Plantar fibromatosis: Not bothersome; continue watchful waiting.  3. History of lung CA, s/p resection, chemo, & radiation: She has been cancer-free for several years and has no concerning symptoms. Given age, follow up w/ oncologist re: lung cancer screening.  4. Health maintenance: Labs as below.  Orders Placed This Encounter  Procedures  . CBC  . Lipid panel  . Hepatitis C antibody  . VITAMIN D 25 Hydroxy (Vit-D Deficiency, Fractures)  . Comprehensive metabolic panel    Discussed warning signs or symptoms. Please see discharge instructions. Patient expresses understanding.

## 2016-06-14 ENCOUNTER — Encounter: Payer: Self-pay | Admitting: Obstetrics & Gynecology

## 2016-06-29 ENCOUNTER — Encounter: Payer: Self-pay | Admitting: Obstetrics & Gynecology

## 2016-06-29 ENCOUNTER — Ambulatory Visit (INDEPENDENT_AMBULATORY_CARE_PROVIDER_SITE_OTHER): Payer: 59 | Admitting: Obstetrics & Gynecology

## 2016-06-29 VITALS — BP 111/79 | HR 90 | Resp 16 | Ht 66.0 in | Wt 177.0 lb

## 2016-06-29 DIAGNOSIS — N950 Postmenopausal bleeding: Secondary | ICD-10-CM | POA: Diagnosis not present

## 2016-06-29 NOTE — Progress Notes (Signed)
   Subjective:    Patient ID: Yvonne Gonzales, female    DOB: 1961-02-16, 56 y.o.   MRN: [id number]  HPI 56 yo lady here to discuss her concern of bleeding after her d&c 12/17. She had a d&c/h/s 12/17 for PMB and has not stopped bleeding since then except for a few days.   Review of Systems  She is on disability     Objective:   Physical Exam WNWHWFNAD Breathing, conversing, and ambulating normally       Assessment & Plan:  PMB s/p d&c/hysteroscopy- discussed a repeat d&c/h/s if bleeding still present in 4 more months versus LAVH/BSO. She would like the surgery but had insurance issues with the anesthesia dept and will speak with [facility] prior to surgery.

## 2016-06-30 ENCOUNTER — Encounter (HOSPITAL_COMMUNITY): Payer: Self-pay

## 2016-07-01 ENCOUNTER — Telehealth: Payer: Self-pay | Admitting: Family Medicine

## 2016-07-01 NOTE — Telephone Encounter (Deleted)
Left vm about getting the flu shot.

## 2016-07-05 NOTE — Telephone Encounter (Signed)
Left pt msg about getting the flu shot.

## 2016-07-08 ENCOUNTER — Encounter (HOSPITAL_COMMUNITY): Payer: Self-pay

## 2016-07-28 ENCOUNTER — Encounter: Payer: Self-pay | Admitting: Family Medicine

## 2016-07-28 ENCOUNTER — Ambulatory Visit (INDEPENDENT_AMBULATORY_CARE_PROVIDER_SITE_OTHER): Payer: 59 | Admitting: Family Medicine

## 2016-07-28 VITALS — BP 139/80 | HR 105 | Temp 98.0°F | Wt 188.0 lb

## 2016-07-28 DIAGNOSIS — R102 Pelvic and perineal pain: Secondary | ICD-10-CM | POA: Diagnosis not present

## 2016-07-28 MED ORDER — CEPHALEXIN 500 MG PO CAPS: 500.0000 mg | ORAL_CAPSULE | Freq: Two times a day (BID) | ORAL | 0 refills | Status: DC

## 2016-07-28 MED ORDER — FLUCONAZOLE 150 MG PO TABS: 150.0000 mg | ORAL_TABLET | Freq: Once | ORAL | 1 refills | Status: AC

## 2016-07-28 NOTE — Progress Notes (Signed)
Yvonne Gonzales is a 56 y.o. female who presents to [place]: [place] today for pelvic discomfort associated with mild urinary frequency headache and fatigue. Symptoms consistent with previous UTI. Patient notes dark colored urine as well. She denies vomiting or diarrhea. She denies vaginal discharge. She feels well otherwise.   Past Medical History:  Diagnosis Date  . Acute pancreatitis   . Arthritis   . Cancer ([place]) Lung (right side)  . Chronic shoulder pain 07/25/2013   Comprehensive Pain Management since cancer treatment.   . Colon polyps   . Complication of anesthesia    prolonged sedation  . Depression 07/25/2013  . Dyspnea    with exertion  . Generalized anxiety disorder 07/25/2013  . GERD (gastroesophageal reflux disease)   . Hyperlipidemia 07/25/2013  . Memory loss   . Mild cognitive impairment 07/25/2013   [facility] Neurology - thought to be secondary to cancer treatment   . Narcolepsy   . Nerve pain    right shoulder, side  . Pancreatic rests in stomach   . Toxic encephalopathy    Past Surgical History:  Procedure Laterality Date  . CHOLECYSTECTOMY    . COLONOSCOPY    . HYSTEROSCOPY W/D&C N/A 03/24/2016   Procedure: DILATATION AND CURETTAGE /HYSTEROSCOPY;  Surgeon: Emily Filbert, MD;  Location: [facility];  Service: Gynecology;  Laterality: N/A;  . LUNG CANCER SURGERY Right    pancost tumor  . LUNG REMOVAL, PARTIAL Right   . lymphectomy    . ribs removed, right side     chest wall removal  . TONSILLECTOMY    . TUBAL LIGATION    . UPPER ESOPHAGEAL ENDOSCOPIC ULTRASOUND (EUS)     Social History  Substance Use Topics  . Smoking status: Former Smoker    Packs/day: 1.50    Years: 35.00    Quit date: 04/16/2007  . Smokeless tobacco: Never Used  . Alcohol use 0.0 oz/week     Comment: rare   family history includes Cancer in her father and mother.  ROS  as above:  Medications: Current Outpatient Prescriptions  Medication Sig Dispense Refill  . acetaminophen (TYLENOL) 500 MG tablet Take 1 tablet (500 mg total) by mouth every 6 (six) hours as needed. 30 tablet 0  . calcium carbonate (TUMS - DOSED IN MG ELEMENTAL CALCIUM) 500 MG chewable tablet Chew 1 tablet by mouth as needed for indigestion or heartburn.    . Calcium-Phosphorus-Vitamin D 100-50-100 MG-MG-UNIT CHEW Chew by mouth.    . cephALEXin (KEFLEX) 500 MG capsule Take 1 capsule (500 mg total) by mouth 2 (two) times daily. 14 capsule 0  . cholecalciferol (VITAMIN D) 1000 UNITS tablet Take 5,000 Units by mouth daily.     . clonazePAM (KLONOPIN) 0.5 MG tablet Take 1 tablet (0.5 mg total) by mouth 2 (two) times daily as needed. for anxiety. 180 tablet 0  . escitalopram (LEXAPRO) 10 MG tablet Take 1 tablet (10 mg total) by mouth daily. 90 tablet 2  . fluconazole (DIFLUCAN) 150 MG tablet Take 1 tablet (150 mg total) by mouth once. 1 tablet 1  . hydrocortisone (ANUSOL-HC) 25 MG suppository Place 1 suppository (25 mg total) rectally 2 (two) times daily. Only PRN rectal irritation. 12 suppository 11  . Krill Oil 300 MG CAPS Take 1 capsule by mouth every morning.     . Magnesium 100 MG CAPS Take 500 mg by mouth every morning.     Marland Kitchen  methylphenidate (RITALIN) 20 MG tablet Take 20 mg by mouth 2 (two) times daily.    Marland Kitchen morphine (MS CONTIN) 15 MG 12 hr tablet Take 15 mg by mouth daily as needed for pain.     Marland Kitchen morphine (MS CONTIN) 30 MG 12 hr tablet Take 30 mg by mouth every 12 (twelve) hours.     . Multiple Vitamin (MULTIVITAMIN) tablet Take 1 tablet by mouth daily.    . naloxegol oxalate (MOVANTIK) 25 MG TABS tablet Take 1 tablet (25 mg total) by mouth daily. 30 tablet 11  . Probiotic Product (PROBIOTIC DAILY PO) Take 1 tablet by mouth every morning.     . triamcinolone (KENALOG) 0.1 % paste Apply to corners of mouth BID as needed. 5 g 12  . zolpidem (AMBIEN) 10 MG tablet Take 1 tablet (10 mg total)  by mouth at bedtime as needed for sleep. 90 tablet 1   No current facility-administered medications for this visit.    Facility-Administered Medications Ordered in Other Visits  Medication Dose Route Frequency Provider Last Rate Last Dose  . iopamidol (ISOVUE-370) 76 % injection 100 mL  100 mL Intravenous Once PRN Marcial Pacas, DO       Allergies  Allergen Reactions  . Latex Hives    Red rash  . Methylphenidate Itching    At '10mg'$   At '10mg'$   At '10mg'$    . Amphetamine-Dextroamphetamine     HA  . Diclofenac Sodium     Voltaren  . Tape   . Aricept [Donepezil Hcl] Other (See Comments)    Sleep paralysis Sleep paralysis  . Nsaids Nausea And Vomiting and Nausea Only    High doses  High doses only  . Tolmetin Nausea And Vomiting    NSAID High doses only NSAID NSAID    Health Maintenance Health Maintenance  Topic Date Due  . Hepatitis C Screening  1960-09-18  . HIV Screening  05/20/1976  . INFLUENZA VACCINE  12/23/2015  . PAP SMEAR  05/15/2017  . MAMMOGRAM  11/04/2017  . COLONOSCOPY  02/26/2021  . TETANUS/TDAP  12/29/2025     Exam:  BP 139/80   Pulse (!) 105   Temp 98 F (36.7 C) (Oral)   Wt 188 lb (85.3 kg)   SpO2 95%   BMI 30.34 kg/m  Gen: Well NAD HEENT: EOMI,  MMM Lungs: Normal work of breathing. CTABL Heart: RRR no MRG Heart rate 86 bpm per my check Abd: NABS, Soft. Nondistended, No CVA angle tenderness to percussion. Mildly tender central lower pelvis. No rebound or guarding or masses palpated. Exts: Brisk capillary refill, warm and well perfused.    No results found for this or any previous visit (from the past 72 hour(s)). No results found.    Assessment and Plan: 56 y.o. female with probable UTI.  Urinalysis significant only for blood. Urine culture pending. Empiric treatment with Keflex. Fluconazole for potential yeast infection on antibiotics. Recheck if not better.   Orders Placed This Encounter  Procedures  . Urine culture   Meds ordered  this encounter  Medications  . cephALEXin (KEFLEX) 500 MG capsule    Sig: Take 1 capsule (500 mg total) by mouth 2 (two) times daily.    Dispense:  14 capsule    Refill:  0  . fluconazole (DIFLUCAN) 150 MG tablet    Sig: Take 1 tablet (150 mg total) by mouth once.    Dispense:  1 tablet    Refill:  1     Discussed  warning signs or symptoms. Please see discharge instructions. Patient expresses understanding.

## 2016-07-28 NOTE — Patient Instructions (Signed)
Thank you for coming in today. Take keflex twice daily for UTI.  Take fluconazole if you have a yeast infection.  Return if not better.  If your belly pain worsens, or you have high fever, bad vomiting, blood in your stool or black tarry stool go to the Emergency Room.    Pelvic Pain, Female Pelvic pain is pain in your lower belly (abdomen), below your belly button and between your hips. The pain may start suddenly (acute), keep coming back (recurring), or last a long time (chronic). Pelvic pain that lasts longer than six months is considered chronic. There are many causes of pelvic pain. Sometimes the cause of your pelvic pain is not known. Follow these instructions at home:  Take over-the-counter and prescription medicines only as told by your doctor.  Rest as told by your doctor.  Do not have sex it if hurts.  Keep a journal of your pelvic pain. Write down:  When the pain started.  Where the pain is located.  What seems to make the pain better or worse, such as food or your menstrual cycle.  Any symptoms you have along with the pain.  Keep all follow-up visits as told by your doctor. This is important. Contact a doctor if:  Medicine does not help your pain.  Your pain comes back.  You have new symptoms.  You have unusual vaginal discharge or bleeding.  You have a fever or chills.  You are having a hard time pooping (constipation).  You have blood in your pee (urine) or poop (stool).  Your pee smells bad.  You feel weak or lightheaded. Get help right away if:  You have sudden pain that is very bad.  Your pain continues to get worse.  You have very bad pain and also have any of the following symptoms:  A fever.  Feeling stick to your stomach (nausea).  Throwing up (vomiting).  Being very sweaty.  You pass out (lose consciousness). This information is not intended to replace advice given to you by your health care provider. Make sure you discuss any  questions you have with your health care provider. Document Released: 10/27/2007 Document Revised: 06/04/2015 Document Reviewed: 02/28/2015 Elsevier Interactive Patient Education  2017 [employer].

## 2016-07-29 ENCOUNTER — Encounter: Payer: Self-pay | Admitting: Family Medicine

## 2016-07-30 LAB — URINE CULTURE: ORGANISM ID, BACTERIA: NO GROWTH

## 2016-08-02 ENCOUNTER — Encounter: Payer: Self-pay | Admitting: Family Medicine

## 2016-08-06 ENCOUNTER — Encounter: Payer: Self-pay | Admitting: Obstetrics & Gynecology

## 2016-08-08 ENCOUNTER — Other Ambulatory Visit: Payer: Self-pay | Admitting: Family Medicine

## 2016-08-11 ENCOUNTER — Other Ambulatory Visit: Payer: Self-pay

## 2016-08-11 ENCOUNTER — Encounter: Payer: Self-pay | Admitting: Family Medicine

## 2016-08-11 MED ORDER — CLONAZEPAM 0.5 MG PO TABS: 0.5000 mg | ORAL_TABLET | Freq: Two times a day (BID) | ORAL | 0 refills | Status: DC | PRN

## 2016-08-21 ENCOUNTER — Encounter: Payer: Self-pay | Admitting: Family Medicine

## 2016-08-30 ENCOUNTER — Telehealth: Payer: Self-pay

## 2016-08-30 NOTE — Telephone Encounter (Signed)
Pt called stating that she has been expecting a rx from [facility] for clonazePAM and was notified that there is a delay due to the pharmacy needing additional from Lynne Leader, MD. Pt request short course of medication be sent to [facility] in [place] to bridge her until her rx can be delivered. Advised pt to contact [facility] pharmacy regarding what additional information is needed. Provided [phone] fax as well. Advised pt the rx request will be routed to Lynne Leader, MD and that she will be contacted with a response. Please advise.

## 2016-08-31 MED ORDER — CLONAZEPAM 0.5 MG PO TABS: 0.5000 mg | ORAL_TABLET | Freq: Two times a day (BID) | ORAL | 0 refills | Status: DC | PRN

## 2016-08-31 NOTE — Telephone Encounter (Signed)
Will fax today

## 2016-09-01 ENCOUNTER — Encounter: Payer: Self-pay | Admitting: Family Medicine

## 2016-09-02 ENCOUNTER — Telehealth: Payer: Self-pay | Admitting: Family Medicine

## 2016-09-02 NOTE — Telephone Encounter (Signed)
Pharmacy advised  

## 2016-09-02 NOTE — Telephone Encounter (Signed)
Ok to fill klonopin.  Morphine is chronic and filled by providers at [facility] all at the same practice. Database reviewed. OK by me.   We have discussed risks of this medicine and she is stable on it chronically.

## 2016-09-02 NOTE — Telephone Encounter (Signed)
[facility] is not filling the Klonopin Rx due to Pt actively getting Morphine Rx's. The pharmacy has to get PCP approval for Pt to take both Rx's. I have pulled the [place] Controlled Substance database and printed for PCP to review.

## 2016-09-09 ENCOUNTER — Ambulatory Visit: Admit: 2016-09-09 | Payer: 59 | Admitting: Obstetrics & Gynecology

## 2016-09-09 SURGERY — HYSTERECTOMY, VAGINAL, LAPAROSCOPY-ASSISTED, WITH SALPINGECTOMY
Anesthesia: Choice | Site: Abdomen | Laterality: Bilateral

## 2016-10-07 ENCOUNTER — Other Ambulatory Visit: Payer: Self-pay

## 2016-10-07 ENCOUNTER — Encounter: Payer: Self-pay | Admitting: Family Medicine

## 2016-11-02 ENCOUNTER — Ambulatory Visit (INDEPENDENT_AMBULATORY_CARE_PROVIDER_SITE_OTHER): Payer: 59 | Admitting: Family Medicine

## 2016-11-02 ENCOUNTER — Encounter: Payer: Self-pay | Admitting: Family Medicine

## 2016-11-02 VITALS — BP 135/81 | HR 108 | Wt 192.0 lb

## 2016-11-02 DIAGNOSIS — Z85118 Personal history of other malignant neoplasm of bronchus and lung: Secondary | ICD-10-CM

## 2016-11-02 DIAGNOSIS — R079 Chest pain, unspecified: Secondary | ICD-10-CM | POA: Diagnosis not present

## 2016-11-02 LAB — CBC WITH DIFFERENTIAL/PLATELET
BASOS PCT: 0 %
Basophils Absolute: 0 cells/uL (ref 0–200)
EOS PCT: 2 %
Eosinophils Absolute: 146 cells/uL (ref 15–500)
HCT: 41.5 % (ref 35.0–45.0)
Hemoglobin: 13.7 g/dL (ref 11.7–15.5)
Lymphocytes Relative: 32 %
Lymphs Abs: 2336 cells/uL (ref 850–3900)
MCH: 29 pg (ref 27.0–33.0)
MCHC: 33 g/dL (ref 32.0–36.0)
MCV: 87.7 fL (ref 80.0–100.0)
MONOS PCT: 6 %
MPV: 9.8 fL (ref 7.5–12.5)
Monocytes Absolute: 438 cells/uL (ref 200–950)
NEUTROS ABS: 4380 {cells}/uL (ref 1500–7800)
Neutrophils Relative %: 60 %
PLATELETS: 280 10*3/uL (ref 140–400)
RBC: 4.73 MIL/uL (ref 3.80–5.10)
RDW: 13.1 % (ref 11.0–15.0)
WBC: 7.3 10*3/uL (ref 3.8–10.8)

## 2016-11-02 MED ORDER — GABAPENTIN 300 MG PO CAPS: ORAL_CAPSULE | ORAL | 3 refills | Status: DC

## 2016-11-02 MED ORDER — VALACYCLOVIR HCL 1 G PO TABS: 1000.0000 mg | ORAL_TABLET | Freq: Three times a day (TID) | ORAL | 0 refills | Status: DC

## 2016-11-02 NOTE — Progress Notes (Signed)
Yvonne Gonzales is a 56 y.o. female who presents to [facility] [place] today for right thoracic back pain. Patient notes a 1 week history of worsening right thoracic back pain. Patient has a history of lung cancer successfully treated as a Pancost tumor several years ago. This required extensive surgery. She has chronic pain in her right upper thoracic back as result of this. However over the past week she's been having worsening pain. She describes electrical sharp stabbing pain that was quite severe at times. This is intermittent. It does not seem to be worse with arm motion. She denies any trouble breathing fevers chills nausea vomiting or diarrhea. She continues to take her morphine as prescribed by pain management. She has tried some topical lidocaine patches which have helped a little.   Past Medical History:  Diagnosis Date  . Acute pancreatitis   . Arthritis   . Cancer ([place]) Lung (right side)  . Chronic shoulder pain 07/25/2013   [facility] since cancer treatment.   . Colon polyps   . Complication of anesthesia    prolonged sedation  . Depression 07/25/2013  . Dyspnea    with exertion  . Generalized anxiety disorder 07/25/2013  . GERD (gastroesophageal reflux disease)   . Hyperlipidemia 07/25/2013  . Memory loss   . Mild cognitive impairment 07/25/2013   [facility] - thought to be secondary to cancer treatment   . Narcolepsy   . Nerve pain    right shoulder, side  . Pancreatic rests in stomach   . Toxic encephalopathy    Past Surgical History:  Procedure Laterality Date  . CHOLECYSTECTOMY    . COLONOSCOPY    . HYSTEROSCOPY W/D&C N/A 03/24/2016   Procedure: DILATATION AND CURETTAGE /HYSTEROSCOPY;  Surgeon: Emily Filbert, MD;  Location: [facility];  Service: Gynecology;  Laterality: N/A;  . LUNG CANCER SURGERY Right    pancost tumor  . LUNG  REMOVAL, PARTIAL Right   . lymphectomy    . ribs removed, right side     chest wall removal  . TONSILLECTOMY    . TUBAL LIGATION    . UPPER ESOPHAGEAL ENDOSCOPIC ULTRASOUND (EUS)     Social History  Substance Use Topics  . Smoking status: Former Smoker    Packs/day: 1.50    Years: 35.00    Quit date: 04/16/2007  . Smokeless tobacco: Never Used  . Alcohol use 0.0 oz/week     Comment: rare   family history includes Cancer in her father and mother.  ROS as above:  Medications: Current Outpatient Prescriptions  Medication Sig Dispense Refill  . acetaminophen (TYLENOL) 500 MG tablet Take 1 tablet (500 mg total) by mouth every 6 (six) hours as needed. 30 tablet 0  . amitriptyline (ELAVIL) 25 MG tablet Take 25 mg by mouth.    . calcium carbonate (TUMS - DOSED IN MG ELEMENTAL CALCIUM) 500 MG chewable tablet Chew 1 tablet by mouth as needed for indigestion or heartburn.    . Calcium-Phosphorus-Vitamin D 100-50-100 MG-MG-UNIT CHEW Chew by mouth.    . cholecalciferol (VITAMIN D) 1000 UNITS tablet Take 5,000 Units by mouth daily.     . clonazePAM (KLONOPIN) 0.5 MG tablet Take 1 tablet (0.5 mg total) by mouth 2 (two) times daily as needed. for anxiety. 20 tablet 0  . hydrocortisone (ANUSOL-HC) 25 MG suppository Place 1 suppository (25 mg total) rectally 2 (two) times daily. Only PRN rectal irritation.  12 suppository 11  . Krill Oil 300 MG CAPS Take 1 capsule by mouth every morning.     . Magnesium 100 MG CAPS Take 500 mg by mouth every morning.     . methylphenidate (RITALIN) 20 MG tablet Take 20 mg by mouth 2 (two) times daily.    Marland Kitchen morphine (MS CONTIN) 15 MG 12 hr tablet Take 15 mg by mouth daily as needed for pain.     Marland Kitchen morphine (MS CONTIN) 30 MG 12 hr tablet Take 30 mg by mouth every 12 (twelve) hours.     . Multiple Vitamin (MULTIVITAMIN) tablet Take 1 tablet by mouth daily.    . naloxegol oxalate (MOVANTIK) 25 MG TABS tablet Take 1 tablet (25 mg total) by mouth daily. 30 tablet 11  .  Probiotic Product (PROBIOTIC DAILY PO) Take 1 tablet by mouth every morning.     . triamcinolone (KENALOG) 0.1 % paste Apply to corners of mouth BID as needed. 5 g 12  . gabapentin (NEURONTIN) 300 MG capsule One tab PO qHS for a week, then BID for a week, then TID. May double weekly to a max of 3,600mg /day 180 capsule 3  . valACYclovir (VALTREX) 1000 MG tablet Take 1 tablet (1,000 mg total) by mouth 3 (three) times daily. 21 tablet 0   No current facility-administered medications for this visit.    Facility-Administered Medications Ordered in Other Visits  Medication Dose Route Frequency Provider Last Rate Last Dose  . iopamidol (ISOVUE-370) 76 % injection 100 mL  100 mL Intravenous Once PRN Hommel, Sean, DO       Allergies  Allergen Reactions  . Latex Hives    Red rash  . Methylphenidate Itching    At 10mg   At 10mg   At 10mg    . Amphetamine-Dextroamphetamine     HA  . Diclofenac Sodium     Voltaren  . Tape   . Aricept [Donepezil Hcl] Other (See Comments)    Sleep paralysis Sleep paralysis  . Nsaids Nausea And Vomiting and Nausea Only    High doses  High doses only  . Tolmetin Nausea And Vomiting    NSAID High doses only NSAID NSAID    Health Maintenance Health Maintenance  Topic Date Due  . Hepatitis C Screening  25-Apr-1961  . HIV Screening  05/20/1976  . INFLUENZA VACCINE  12/22/2016  . PAP SMEAR  05/15/2017  . MAMMOGRAM  11/04/2017  . COLONOSCOPY  02/26/2021  . TETANUS/TDAP  12/29/2025     Exam:  BP 135/81   Pulse (!) 108   Wt 192 lb (87.1 kg)   BMI 30.99 kg/m  Gen: Well NAD HEENT: EOMI,  MMM Lungs: Normal work of breathing. CTABL Heart: Mild tachycardia no MRG Abd: NABS, Soft. Nondistended, Nontender Exts: Brisk capillary refill, warm and well perfused.  Right thoracic back reveals a large well appearing surgical incision. The right thoracic back is nontender. There is a small macular erythematous lesion about 1 cm at the lateral aspect of her right  thoracic just posterior to the right breast. There are no vesicles on this lesion. This is nontender.   No results found for this or any previous visit (from the past 72 hour(s)). No results found.    Assessment and Plan: 56 y.o. female with significantly worsening pain at the site of a previous cancer and surgical incision. This is concerning for several etiologies including nerve involvement, new or tumor recurrence, infection, and also shingles. She does have a small erythematous spot  on her right thoracic back that may be the very early stages of the shingles rash.  Proceed with workup while treating empirically for shingles and treating for symptom control.  Workup will consist of a CT chest with contrast metabolic panel CBC and sedimentation rate.  Treatment will consist of Valtrex 1000 mg 3 times daily for one week and gabapentin titration. Recheck next week.   Orders Placed This Encounter  Procedures  . CT CHEST W CONTRAST    Standing Status:   Future    Standing Expiration Date:   01/02/2018    Order Specific Question:   If indicated for the ordered procedure, I authorize the administration of contrast media per Radiology protocol    Answer:   Yes    Order Specific Question:   Reason for Exam (SYMPTOM  OR DIAGNOSIS REQUIRED)    Answer:   eval right upper back/chest pain worsening. Hx lung cancer s/p thoracotomy    Order Specific Question:   Is patient pregnant?    Answer:   No    Order Specific Question:   Preferred imaging location?    Answer:   [facility]    Order Specific Question:   Radiology Contrast Protocol - do NOT remove file path    Answer:   [email or web address]  . COMPLETE METABOLIC PANEL WITH GFR  . CBC with Differential/Platelet  . Sedimentation rate   Meds ordered this encounter  Medications  . gabapentin (NEURONTIN) 300 MG capsule    Sig: One tab PO qHS for a week, then BID for a week, then TID. May double weekly to  a max of 3,600mg /day    Dispense:  180 capsule    Refill:  3  . valACYclovir (VALTREX) 1000 MG tablet    Sig: Take 1 tablet (1,000 mg total) by mouth 3 (three) times daily.    Dispense:  21 tablet    Refill:  0     Discussed warning signs or symptoms. Please see discharge instructions. Patient expresses understanding.  I spent 40 minutes with this patient, greater than 50% was face-to-face time counseling regarding the above diagnosis.

## 2016-11-02 NOTE — Patient Instructions (Signed)
Thank you for coming in today. Take valtrex three times daily for 1 week.  Start gabapentin at night.  Get labs now.  CT will likely be done tomorrow.  Call or go to the emergency room if you get worse, have trouble breathing, have chest pains, or palpitations.  Recheck later this week or early next week.

## 2016-11-03 ENCOUNTER — Encounter: Payer: Self-pay | Admitting: Family Medicine

## 2016-11-03 ENCOUNTER — Ambulatory Visit (INDEPENDENT_AMBULATORY_CARE_PROVIDER_SITE_OTHER): Payer: 59

## 2016-11-03 DIAGNOSIS — K219 Gastro-esophageal reflux disease without esophagitis: Secondary | ICD-10-CM

## 2016-11-03 DIAGNOSIS — Z85118 Personal history of other malignant neoplasm of bronchus and lung: Secondary | ICD-10-CM | POA: Diagnosis not present

## 2016-11-03 DIAGNOSIS — R079 Chest pain, unspecified: Secondary | ICD-10-CM | POA: Diagnosis not present

## 2016-11-03 LAB — COMPLETE METABOLIC PANEL WITH GFR
ALK PHOS: 72 U/L (ref 33–130)
ALT: 15 U/L (ref 6–29)
AST: 17 U/L (ref 10–35)
Albumin: 3.9 g/dL (ref 3.6–5.1)
BILIRUBIN TOTAL: 0.5 mg/dL (ref 0.2–1.2)
BUN: 13 mg/dL (ref 7–25)
CALCIUM: 9.3 mg/dL (ref 8.6–10.4)
CO2: 27 mmol/L (ref 20–31)
Chloride: 102 mmol/L (ref 98–110)
Creat: 0.75 mg/dL (ref 0.50–1.05)
Glucose, Bld: 89 mg/dL (ref 65–99)
Potassium: 4.1 mmol/L (ref 3.5–5.3)
Sodium: 139 mmol/L (ref 135–146)
TOTAL PROTEIN: 7.4 g/dL (ref 6.1–8.1)

## 2016-11-03 LAB — SEDIMENTATION RATE: SED RATE: 12 mm/h (ref 0–30)

## 2016-11-03 MED ORDER — IOPAMIDOL (ISOVUE-300) INJECTION 61%
100.0000 mL | Freq: Once | INTRAVENOUS | Status: AC | PRN
  Administered 2016-11-03: 100 mL via INTRAVENOUS

## 2016-11-29 ENCOUNTER — Other Ambulatory Visit: Payer: Self-pay | Admitting: Family Medicine

## 2016-11-29 DIAGNOSIS — N6489 Other specified disorders of breast: Secondary | ICD-10-CM

## 2016-12-06 ENCOUNTER — Other Ambulatory Visit: Payer: Self-pay | Admitting: Family Medicine

## 2016-12-07 ENCOUNTER — Encounter: Payer: Self-pay | Admitting: Family Medicine

## 2016-12-07 MED ORDER — CLONAZEPAM 0.5 MG PO TABS: 0.5000 mg | ORAL_TABLET | Freq: Two times a day (BID) | ORAL | 0 refills | Status: DC | PRN

## 2016-12-13 ENCOUNTER — Ambulatory Visit
Admission: RE | Admit: 2016-12-13 | Discharge: 2016-12-13 | Disposition: A | Discharge status: Home / Self Care | Payer: 59 | Source: Ambulatory Visit | Attending: Family Medicine | Admitting: Family Medicine

## 2016-12-13 DIAGNOSIS — N6489 Other specified disorders of breast: Secondary | ICD-10-CM

## 2016-12-16 ENCOUNTER — Encounter: Payer: Self-pay | Admitting: Family Medicine

## 2016-12-17 MED ORDER — CLONAZEPAM 0.5 MG PO TABS: 0.5000 mg | ORAL_TABLET | Freq: Two times a day (BID) | ORAL | 0 refills | Status: DC | PRN

## 2017-01-19 ENCOUNTER — Other Ambulatory Visit: Payer: Self-pay | Admitting: Family Medicine

## 2017-01-20 ENCOUNTER — Encounter: Payer: Self-pay | Admitting: Family Medicine

## 2017-01-20 ENCOUNTER — Other Ambulatory Visit: Payer: Self-pay | Admitting: Family Medicine

## 2017-01-21 ENCOUNTER — Encounter: Payer: Self-pay | Admitting: Family Medicine

## 2017-02-21 ENCOUNTER — Other Ambulatory Visit: Payer: Self-pay | Admitting: Family Medicine

## 2017-02-23 ENCOUNTER — Encounter: Payer: Self-pay | Admitting: Family Medicine

## 2017-02-23 ENCOUNTER — Other Ambulatory Visit: Payer: Self-pay | Admitting: Family Medicine

## 2017-02-25 MED ORDER — CLONAZEPAM 0.5 MG PO TABS: 0.5000 mg | ORAL_TABLET | Freq: Two times a day (BID) | ORAL | 0 refills | Status: DC | PRN

## 2017-03-30 ENCOUNTER — Other Ambulatory Visit: Payer: Self-pay | Admitting: Family Medicine

## 2017-04-01 ENCOUNTER — Telehealth: Payer: Self-pay | Admitting: Family Medicine

## 2017-04-01 MED ORDER — CLONAZEPAM 0.5 MG PO TABS: 0.5000 mg | ORAL_TABLET | Freq: Two times a day (BID) | ORAL | 0 refills | Status: DC | PRN

## 2017-04-06 ENCOUNTER — Ambulatory Visit (INDEPENDENT_AMBULATORY_CARE_PROVIDER_SITE_OTHER): Payer: 59 | Admitting: Family Medicine

## 2017-04-06 ENCOUNTER — Encounter: Payer: Self-pay | Admitting: Family Medicine

## 2017-04-06 VITALS — BP 115/75 | HR 99 | Temp 97.9°F | Ht 65.0 in | Wt 183.0 lb

## 2017-04-06 DIAGNOSIS — R202 Paresthesia of skin: Secondary | ICD-10-CM | POA: Diagnosis not present

## 2017-04-06 DIAGNOSIS — G92 Toxic encephalopathy: Secondary | ICD-10-CM

## 2017-04-06 DIAGNOSIS — Z114 Encounter for screening for human immunodeficiency virus [HIV]: Secondary | ICD-10-CM | POA: Diagnosis not present

## 2017-04-06 DIAGNOSIS — Z85118 Personal history of other malignant neoplasm of bronchus and lung: Secondary | ICD-10-CM

## 2017-04-06 DIAGNOSIS — E782 Mixed hyperlipidemia: Secondary | ICD-10-CM

## 2017-04-06 DIAGNOSIS — Z1159 Encounter for screening for other viral diseases: Secondary | ICD-10-CM

## 2017-04-06 DIAGNOSIS — G929 Unspecified toxic encephalopathy: Secondary | ICD-10-CM

## 2017-04-06 NOTE — Progress Notes (Signed)
Yvonne Gonzales is a 56 y.o. female who presents to [facility] today for painful toes.  Yvonne Gonzales notes numbness tingling and pain into her fourth toes bilaterally worsening over the past several months.  She denies any injury weakness or numbness.  She denies fevers chills nausea vomiting she has a history of lung cancer with chemotherapy about 10 years ago causing effectively toxic encephalopathy with some permanent loss of cognitive ability.  She is not sure she ever was diagnosed with neuropathy.  She cannot remember which agents were used.  She has not tried any treatment yet.   Past Medical History:  Diagnosis Date  . Acute pancreatitis   . Arthritis   . Cancer ([place]) Lung (right side)  . Chronic shoulder pain 07/25/2013   [facility] since cancer treatment.   . Colon polyps   . Complication of anesthesia    prolonged sedation  . Depression 07/25/2013  . Dyspnea    with exertion  . Generalized anxiety disorder 07/25/2013  . GERD (gastroesophageal reflux disease)   . Hyperlipidemia 07/25/2013  . Memory loss   . Mild cognitive impairment 07/25/2013   [facility] - thought to be secondary to cancer treatment   . Narcolepsy   . Nerve pain    right shoulder, side  . Pancreatic rests in stomach   . Toxic encephalopathy    Past Surgical History:  Procedure Laterality Date  . CHOLECYSTECTOMY    . COLONOSCOPY    . LUNG CANCER SURGERY Right    pancost tumor  . LUNG REMOVAL, PARTIAL Right   . lymphectomy    . ribs removed, right side     chest wall removal  . TONSILLECTOMY    . TUBAL LIGATION    . UPPER ESOPHAGEAL ENDOSCOPIC ULTRASOUND (EUS)     Social History   Tobacco Use  . Smoking status: Former Smoker    Packs/day: 1.50    Years: 35.00    Pack years: 52.50    Last attempt to quit: 04/16/2007    Years since quitting: 9.9  .  Smokeless tobacco: Never Used  Substance Use Topics  . Alcohol use: Yes    Alcohol/week: 0.0 oz    Comment: rare   family history includes Breast cancer (age of onset: 26) in her mother; Cancer in her father and mother.  ROS as above:  Medications: Current Outpatient Medications  Medication Sig Dispense Refill  . acetaminophen (TYLENOL) 500 MG tablet Take 1 tablet (500 mg total) by mouth every 6 (six) hours as needed. 30 tablet 0  . amitriptyline (ELAVIL) 25 MG tablet Take 25 mg by mouth.    . calcium carbonate (TUMS - DOSED IN MG ELEMENTAL CALCIUM) 500 MG chewable tablet Chew 1 tablet by mouth as needed for indigestion or heartburn.    . Calcium-Phosphorus-Vitamin D 100-50-100 MG-MG-UNIT CHEW Chew by mouth.    . cholecalciferol (VITAMIN D) 1000 UNITS tablet Take 5,000 Units by mouth daily.     . clonazePAM (KLONOPIN) 0.5 MG tablet Take 1 tablet (0.5 mg total) 2 (two) times daily as needed by mouth. Please follow up for anxiety. 120 tablet 0  . gabapentin (NEURONTIN) 300 MG capsule One tab PO qHS for a week, then BID for a week, then TID. May double weekly to a max of 3,600mg /day 180 capsule 3  . hydrocortisone (ANUSOL-HC) 25 MG suppository Place 1 suppository (25 mg total) rectally 2 (two) times  daily. Only PRN rectal irritation. 12 suppository 11  . Krill Oil 300 MG CAPS Take 1 capsule by mouth every morning.     . Magnesium 100 MG CAPS Take 500 mg by mouth every morning.     . methylphenidate (RITALIN) 20 MG tablet Take 20 mg by mouth 2 (two) times daily.    Marland Kitchen morphine (MS CONTIN) 15 MG 12 hr tablet Take 15 mg by mouth daily as needed for pain.     Marland Kitchen morphine (MS CONTIN) 30 MG 12 hr tablet Take 30 mg by mouth every 12 (twelve) hours.     . Multiple Vitamin (MULTIVITAMIN) tablet Take 1 tablet by mouth daily.    . naloxegol oxalate (MOVANTIK) 25 MG TABS tablet Take 1 tablet (25 mg total) by mouth daily. 30 tablet 11  . Probiotic Product (PROBIOTIC DAILY PO) Take 1 tablet by mouth every  morning.     . triamcinolone (KENALOG) 0.1 % paste Apply to corners of mouth BID as needed. 5 g 12  . valACYclovir (VALTREX) 1000 MG tablet Take 1 tablet (1,000 mg total) by mouth 3 (three) times daily. 21 tablet 0   No current facility-administered medications for this visit.    Allergies  Allergen Reactions  . Latex Hives    Red rash  . Methylphenidate Itching    At 10mg   At 10mg   At 10mg    . Amphetamine-Dextroamphetamine     HA  . Diclofenac Sodium     Voltaren  . Tape   . Aricept [Donepezil Hcl] Other (See Comments)    Sleep paralysis Sleep paralysis  . Nsaids Nausea And Vomiting and Nausea Only    High doses  High doses only  . Tolmetin Nausea And Vomiting    NSAID High doses only NSAID NSAID    Health Maintenance Health Maintenance  Topic Date Due  . Hepatitis C Screening  Jul 02, 1960  . HIV Screening  05/20/1976  . INFLUENZA VACCINE  04/06/2018 (Originally 12/22/2016)  . PAP SMEAR  05/15/2017  . MAMMOGRAM  12/14/2018  . COLONOSCOPY  02/26/2021  . TETANUS/TDAP  12/29/2025     Exam:  BP 115/75   Pulse 99   Temp 97.9 F (36.6 C) (Oral)   Ht 5\' 5"  (1.651 m)   Wt 183 lb (83 kg)   BMI 30.45 kg/m  Gen: Well NAD HEENT: EOMI,  MMM Lungs: Normal work of breathing. CTABL Heart: RRR no MRG Abd: NABS, Soft. Nondistended, Nontender Exts: Brisk capillary refill, warm and well perfused.  Feet normal-appearing.  Pulses capillary refill and sensation are intact.  The fourth toes intact sensation but are more sensitive with hyperesthesia to light touch.    No results found for this or any previous visit (from the past 72 hour(s)). No results found.    Assessment and Plan: 56 y.o. female with paresthesias to toes bilaterally.  Etiology is unclear however I am suspicious for this being an peripheral neuropathy related to previous chemotherapy exposure.  Plan for metabolic workup listed below along with basic fasting labs for routine health maintenance.  Study and  advised patient to increase her gabapentin from 3 times daily to 600 3 times daily.  Recheck in 6 weeks.   Orders Placed This Encounter  Procedures  . CBC  . TSH  . Vitamin B12  . COMPLETE METABOLIC PANEL WITH GFR  . Lipid Panel w/reflex Direct LDL  . VITAMIN D 25 Hydroxy (Vit-D Deficiency, Fractures)  . HIV antibody  . Hepatitis C antibody  .  NCV with EMG(electromyography)    Standing Status:   Future    Standing Expiration Date:   04/06/2018    Scheduling Instructions:     [facility] if able     Phoncharoensri, Yetta Barre, MD      [address]      [address]      [place], [place] [address]      [phone]      [phone] (Fax)    Order Specific Question:   Where should this test be performed?    Answer:   other   No orders of the defined types were placed in this encounter.    Discussed warning signs or symptoms. Please see discharge instructions. Patient expresses understanding.  CC:  Phoncharoensri, Yetta Barre, MD  [address]  [place], [place] [address]  [phone]  [phone] (Fax)

## 2017-04-06 NOTE — Patient Instructions (Addendum)
Thank you for coming in today. You should hear from neurology soon about the EMG/nerve condition study.  Let me know if you do not hear anything.  You can increase the gabapentin to up to 3 pills three times daily.  Recheck in 6 weeks to go over results and see how you are doing.   Get labs today.    Paresthesia Paresthesia is a burning or prickling feeling. This feeling can happen in any part of the body. It often happens in the hands, arms, legs, or feet. Usually, it is not painful. In most cases, the feeling goes away in a short time and is not a sign of a serious problem. Follow these instructions at home:  Avoid drinking alcohol.  Try massage or needle therapy (acupuncture) to help with your problems.  Keep all follow-up visits as told by your doctor. This is important. Contact a doctor if:  You keep on having episodes of paresthesia.  Your burning or prickling feeling gets worse when you walk.  You have pain or cramps.  You feel dizzy.  You have a rash. Get help right away if:  You feel weak.  You have trouble walking or moving.  You have problems speaking, understanding, or seeing.  You feel confused.  You cannot control when you pee (urinate) or poop (bowel movement).  You lose feeling (numbness) after an injury.  You pass out (faint). This information is not intended to replace advice given to you by your health care provider. Make sure you discuss any questions you have with your health care provider. Document Released: 04/22/2008 Document Revised: 10/16/2015 Document Reviewed: 05/06/2014 Elsevier Interactive Patient Education  [employer].

## 2017-04-07 ENCOUNTER — Encounter: Payer: Self-pay | Admitting: Family Medicine

## 2017-04-07 LAB — CBC
HCT: 39.2 % (ref 35.0–45.0)
HEMOGLOBIN: 13.4 g/dL (ref 11.7–15.5)
MCH: 29.6 pg (ref 27.0–33.0)
MCHC: 34.2 g/dL (ref 32.0–36.0)
MCV: 86.7 fL (ref 80.0–100.0)
MPV: 10.2 fL (ref 7.5–12.5)
PLATELETS: 298 10*3/uL (ref 140–400)
RBC: 4.52 10*6/uL (ref 3.80–5.10)
RDW: 12.1 % (ref 11.0–15.0)
WBC: 8 10*3/uL (ref 3.8–10.8)

## 2017-04-07 LAB — HEPATITIS C ANTIBODY
HEP C AB: NONREACTIVE
SIGNAL TO CUT-OFF: 0.01 (ref <1.00)

## 2017-04-07 LAB — COMPLETE METABOLIC PANEL WITH GFR
AG Ratio: 1.4 (calc) (ref 1.0–2.5)
ALKALINE PHOSPHATASE (APISO): 79 U/L (ref 33–130)
ALT: 10 U/L (ref 6–29)
AST: 12 U/L (ref 10–35)
Albumin: 4.2 g/dL (ref 3.6–5.1)
BUN: 11 mg/dL (ref 7–25)
CALCIUM: 9.4 mg/dL (ref 8.6–10.4)
CO2: 30 mmol/L (ref 20–32)
CREATININE: 0.78 mg/dL (ref 0.50–1.05)
Chloride: 103 mmol/L (ref 98–110)
GFR, EST NON AFRICAN AMERICAN: 86 mL/min/{1.73_m2} (ref >=60)
GFR, Est African American: 99 mL/min/{1.73_m2} (ref >=60)
GLUCOSE: 92 mg/dL (ref 65–99)
Globulin: 2.9 g/dL (calc) (ref 1.9–3.7)
Potassium: 4.1 mmol/L (ref 3.5–5.3)
Sodium: 141 mmol/L (ref 135–146)
Total Bilirubin: 0.7 mg/dL (ref 0.2–1.2)
Total Protein: 7.1 g/dL (ref 6.1–8.1)

## 2017-04-07 LAB — LIPID PANEL W/REFLEX DIRECT LDL
CHOL/HDL RATIO: 5.6 (calc) — AB (ref <5.0)
CHOLESTEROL: 217 mg/dL — AB (ref <200)
HDL: 39 mg/dL — ABNORMAL LOW (ref >50)
LDL CHOLESTEROL (CALC): 142 mg/dL — AB
NON-HDL CHOLESTEROL (CALC): 178 mg/dL — AB (ref <130)
Triglycerides: 219 mg/dL — ABNORMAL HIGH (ref <150)

## 2017-04-07 LAB — VITAMIN D 25 HYDROXY (VIT D DEFICIENCY, FRACTURES): Vit D, 25-Hydroxy: 65 ng/mL (ref 30–100)

## 2017-04-07 LAB — HIV ANTIBODY (ROUTINE TESTING W REFLEX): HIV: NONREACTIVE

## 2017-04-07 LAB — VITAMIN B12

## 2017-04-07 LAB — TSH: TSH: 2.38 m[IU]/L

## 2017-04-11 MED ORDER — ATORVASTATIN CALCIUM 40 MG PO TABS: 40.0000 mg | ORAL_TABLET | Freq: Every day | ORAL | 3 refills | Status: DC

## 2017-05-12 ENCOUNTER — Ambulatory Visit (INDEPENDENT_AMBULATORY_CARE_PROVIDER_SITE_OTHER): Payer: 59 | Admitting: Family Medicine

## 2017-05-12 ENCOUNTER — Encounter: Payer: Self-pay | Admitting: Family Medicine

## 2017-05-12 VITALS — BP 134/84 | HR 94 | Wt 179.0 lb

## 2017-05-12 DIAGNOSIS — I10 Essential (primary) hypertension: Secondary | ICD-10-CM | POA: Diagnosis not present

## 2017-05-12 DIAGNOSIS — E782 Mixed hyperlipidemia: Secondary | ICD-10-CM

## 2017-05-12 DIAGNOSIS — R202 Paresthesia of skin: Secondary | ICD-10-CM | POA: Diagnosis not present

## 2017-05-12 MED ORDER — AMITRIPTYLINE HCL 25 MG PO TABS: 25.0000 mg | ORAL_TABLET | Freq: Every day | ORAL | 3 refills | Status: AC

## 2017-05-12 NOTE — Patient Instructions (Addendum)
Thank you for coming in today. Continue lipitor.  Recheck in April or so.  Recheck sooner if needed.  Work on taping the toes together.  Get fasting labs in about 2 weeks.    How to Buddy Tape Buddy taping refers to taping an injured finger or toe to an uninjured finger or toe that is next to it. This protects the injured finger or toe and keeps it from moving while the injury heals. You may buddy tape a finger or toe if you have a minor sprain. Your health care provider may buddy tape your finger or toe if you have a sprain, dislocation, or fracture. You may be told to replace your buddy taping as needed. What are the risks? Generally, buddy taping is safe. However, problems may occur, such as:  Skin injury or infection.  Reduced blood flow to the finger or toe.  Skin reaction to the tape.  Do not buddy tape your toe if you have diabetes. Do not buddy tape if you know that you have an allergy to adhesives or surgical tape. How to buddy tape Before Buddy Taping Try to reduce any pain and swelling with rest, icing, and elevation:  Avoid any activity that causes pain.  Raise (elevate) your hand or foot above the level of your heart while you are sitting or lying down.  If directed, apply ice to the injured area: ? Put ice in a plastic bag. ? Place a towel between your skin and the bag. ? Leave the ice on for 20 minutes, 2-3 times per day.  Buddy Taping Procedure  Clean and dry your finger or toe as told by your health care provider.  Place a gauze pad or a piece of cloth or cotton between your injured finger or toe and the uninjured finger or toe.  Use tape to wrap around both fingers or toes so your injured finger or toe is secured to the uninjured finger or toe. ? The tape should be snug, but not tight. ? Make sure the ends of the piece of tape overlap. ? Avoid placing tape directly over the joint.  Change the tape and the padding as told by your health care provider.  Remove and replace the tape or padding if it becomes loose, worn, dirty, or wet. After Buddy Taping  Take over-the-counter and prescription medicines only as told by your health care provider.  Return to your normal activities as told by your health care provider. Ask your health care provider what activities are safe for you.  Watch the buddy-taped area and always remove buddy taping if: ? Your pain gets worse. ? Your fingers turn pale or blue. ? Your skin becomes irritated. Contact a health care provider if:  You have pain, swelling, or bruising that lasts longer than three days.  You have a fever.  Your skin is red, cracked, or irritated. Get help right away if:  The injured area becomes cold, numb, or pale.  You have severe pain, swelling, bruising, or loss of movement in your finger or toe.  Your finger or toe changes shape (deformity). This information is not intended to replace advice given to you by your health care provider. Make sure you discuss any questions you have with your health care provider. Document Released: 06/17/2004 Document Revised: 10/16/2015 Document Reviewed: 10/02/2014 Elsevier Interactive Patient Education  [employer].

## 2017-05-12 NOTE — Progress Notes (Signed)
Yvonne Gonzales is a 56 y.o. female who presents to [place]: [place] today for paresthesia and foot pain.  We will also discuss hypertension and hyperlipidemia.  Yvonne Gonzales has bilateral lateral toes and foot pain and leg numbness.  She had a nerve conduction study done about a month ago.  The results are not yet available.  She reports that she was told it was minor nerve injury.  She notes however that she has increased her gabapentin as instructed and she is feeling a lot better.  She takes 600 mg twice daily notes that her foot pain is much better.  She points to her left fourth toe and noticed that the tip of the toe is very sensitive and typically bumps into the end of her shoes and is very painful at times.  Hyperlipidemia: Yvonne Gonzales was started on atorvastatin at the last visit a month ago.  She tolerates medication well but denies significant muscle aches or pain.  Past Medical History:  Diagnosis Date  . Acute pancreatitis   . Arthritis   . Cancer ([place]) Lung (right side)  . Chronic shoulder pain 07/25/2013   [facility] since cancer treatment.   . Colon polyps   . Complication of anesthesia    prolonged sedation  . Depression 07/25/2013  . Dyspnea    with exertion  . Generalized anxiety disorder 07/25/2013  . GERD (gastroesophageal reflux disease)   . Hyperlipidemia 07/25/2013  . Memory loss   . Mild cognitive impairment 07/25/2013   [facility] - thought to be secondary to cancer treatment   . Narcolepsy   . Nerve pain    right shoulder, side  . Pancreatic rests in stomach   . Toxic encephalopathy    Past Surgical History:  Procedure Laterality Date  . CHOLECYSTECTOMY    . COLONOSCOPY    . HYSTEROSCOPY W/D&C N/A 03/24/2016   Procedure: DILATATION AND CURETTAGE /HYSTEROSCOPY;  Surgeon: Emily Filbert, MD;  Location: [facility];  Service: Gynecology;   Laterality: N/A;  . LUNG CANCER SURGERY Right    pancost tumor  . LUNG REMOVAL, PARTIAL Right   . lymphectomy    . ribs removed, right side     chest wall removal  . TONSILLECTOMY    . TUBAL LIGATION    . UPPER ESOPHAGEAL ENDOSCOPIC ULTRASOUND (EUS)     Social History   Tobacco Use  . Smoking status: Former Smoker    Packs/day: 1.50    Years: 35.00    Pack years: 52.50    Last attempt to quit: 04/16/2007    Years since quitting: 10.0  . Smokeless tobacco: Never Used  Substance Use Topics  . Alcohol use: Yes    Alcohol/week: 0.0 oz    Comment: rare   family history includes Breast cancer (age of onset: 49) in her mother; Cancer in her father and mother.  ROS as above:  Medications: Current Outpatient Medications  Medication Sig Dispense Refill  . acetaminophen (TYLENOL) 500 MG tablet Take 1 tablet (500 mg total) by mouth every 6 (six) hours as needed. 30 tablet 0  . amitriptyline (ELAVIL) 25 MG tablet Take 1 tablet (25 mg total) by mouth at bedtime. 90 tablet 3  . atorvastatin (LIPITOR) 40 MG tablet Take 1 tablet (40 mg total) daily by mouth. 90 tablet 3  . calcium carbonate (TUMS - DOSED IN MG ELEMENTAL CALCIUM) 500 MG chewable tablet Chew 1 tablet by  mouth as needed for indigestion or heartburn.    . Calcium-Phosphorus-Vitamin D 100-50-100 MG-MG-UNIT CHEW Chew by mouth.    . cholecalciferol (VITAMIN D) 1000 UNITS tablet Take 5,000 Units by mouth daily.     . clonazePAM (KLONOPIN) 0.5 MG tablet Take 1 tablet (0.5 mg total) 2 (two) times daily as needed by mouth. Please follow up for anxiety. 120 tablet 0  . gabapentin (NEURONTIN) 300 MG capsule One tab PO qHS for a week, then BID for a week, then TID. May double weekly to a max of 3,600mg /day 180 capsule 3  . hydrocortisone (ANUSOL-HC) 25 MG suppository Place 1 suppository (25 mg total) rectally 2 (two) times daily. Only PRN rectal irritation. 12 suppository 11  . Krill Oil 300 MG CAPS Take 1 capsule by mouth every morning.      . methylphenidate (RITALIN) 20 MG tablet Take 20 mg by mouth 2 (two) times daily.    Marland Kitchen morphine (MS CONTIN) 15 MG 12 hr tablet Take 15 mg by mouth daily as needed for pain.     Marland Kitchen morphine (MS CONTIN) 30 MG 12 hr tablet Take 30 mg by mouth every 12 (twelve) hours.     . Multiple Vitamin (MULTIVITAMIN) tablet Take 1 tablet by mouth daily.    . naloxegol oxalate (MOVANTIK) 25 MG TABS tablet Take 1 tablet (25 mg total) by mouth daily. 30 tablet 11  . triamcinolone (KENALOG) 0.1 % paste Apply to corners of mouth BID as needed. 5 g 12  . valACYclovir (VALTREX) 1000 MG tablet Take 1 tablet (1,000 mg total) by mouth 3 (three) times daily. 21 tablet 0   No current facility-administered medications for this visit.    Allergies  Allergen Reactions  . Latex Hives    Red rash  . Methylphenidate Itching    At 10mg   At 10mg   At 10mg    . Amphetamine-Dextroamphetamine     HA  . Diclofenac Sodium     Voltaren  . Tape   . Aricept [Donepezil Hcl] Other (See Comments)    Sleep paralysis Sleep paralysis  . Nsaids Nausea And Vomiting and Nausea Only    High doses  High doses only  . Tolmetin Nausea And Vomiting    NSAID High doses only NSAID NSAID    Health Maintenance Health Maintenance  Topic Date Due  . INFLUENZA VACCINE  04/06/2018 (Originally 12/22/2016)  . PAP SMEAR  05/15/2017  . MAMMOGRAM  12/14/2018  . COLONOSCOPY  02/26/2021  . TETANUS/TDAP  12/29/2025  . Hepatitis C Screening  Completed  . HIV Screening  Completed     Exam:  BP 134/84   Pulse 94   Wt 179 lb (81.2 kg)   BMI 29.79 kg/m  Gen: Well NAD HEENT: EOMI,  MMM Lungs: Normal work of breathing. CTABL Heart: RRR no MRG Abd: NABS, Soft. Nondistended, Nontender Exts: Brisk capillary refill, warm and well perfused.  Left foot: The toes are somewhat splayed which isolates the left fourth toe.   No results found for this or any previous visit (from the past 72 hour(s)). No results found.    Assessment and  Plan: 56 y.o. female with  Left foot pain: Nerve conduction studies pending.  Plan to continue higher dose of gabapentin watchful waiting.  We will also work on buddy taping the fourth and fifth toes as this should help stabilize the little bit.  Also will continue amitriptyline.  Hyperlipidemia: Tolerating Lipitor well.  Will check metabolic panel and CBC, CMP.  Orders Placed This Encounter  Procedures  . COMPLETE METABOLIC PANEL WITH GFR  . Lipid Panel w/reflex Direct LDL  . CBC   Meds ordered this encounter  Medications  . amitriptyline (ELAVIL) 25 MG tablet    Sig: Take 1 tablet (25 mg total) by mouth at bedtime.    Dispense:  90 tablet    Refill:  3     Discussed warning signs or symptoms. Please see discharge instructions. Patient expresses understanding.

## 2017-05-13 ENCOUNTER — Telehealth: Payer: Self-pay | Admitting: Family Medicine

## 2017-05-13 NOTE — Telephone Encounter (Signed)
EMG results are back showing right peroneal nerve injury but no significant wide spread neuropathy.  Continue gabapentin and we will continue to follow.

## 2017-05-13 NOTE — Telephone Encounter (Signed)
Left detailed message on patient vm with results as noted below. Rhonda Cunningham,CMA

## 2017-05-30 ENCOUNTER — Ambulatory Visit: Payer: 59 | Admitting: Family Medicine

## 2017-06-07 ENCOUNTER — Encounter: Payer: Self-pay | Admitting: Family Medicine

## 2017-06-07 ENCOUNTER — Other Ambulatory Visit: Payer: Self-pay | Admitting: Family Medicine

## 2017-06-09 MED ORDER — CLONAZEPAM 0.5 MG PO TABS: 0.5000 mg | ORAL_TABLET | Freq: Two times a day (BID) | ORAL | 1 refills | Status: DC | PRN

## 2017-06-10 MED ORDER — GABAPENTIN 300 MG PO CAPS: 600.0000 mg | ORAL_CAPSULE | Freq: Three times a day (TID) | ORAL | 3 refills | Status: DC

## 2017-06-22 IMAGING — CT CT CHEST W/ CM
2 of 3 series · 15 of 36 positions shown, 18 images · IV contrast (iopamidol)
Comparison: Chest CT dated 10/23/2013.

CLINICAL DATA: Pain radiating from right shoulder to back and
flank, and radiating into chest, for 2 weeks. History of lung cancer
status post thoracotomy. History of hyperlipidemia, GERD, acute
pancreatitis, cholecystectomy.

EXAM:
CT CHEST WITH CONTRAST
TECHNIQUE: Multidetector CT imaging of the chest was performed during
intravenous contrast administration.
CONTRAST:  100mL 9GQVUC-APP IOPAMIDOL (9GQVUC-APP) INJECTION 61%

[Series 2: axial st · axial · 0.75mm/px · z∈[-341,-76]mm · 12 of 63 slices shown, 15 images]
[im 5/63  mediastinal]
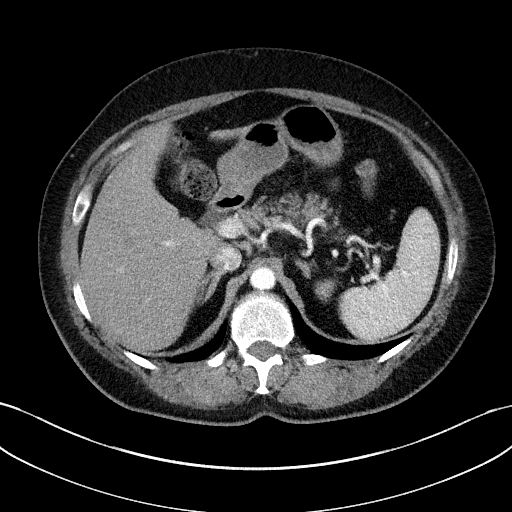
[im 5/63  lung]
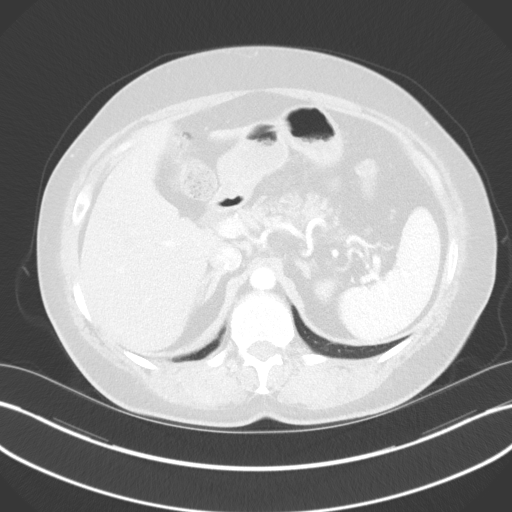
[im 10/63  lung]
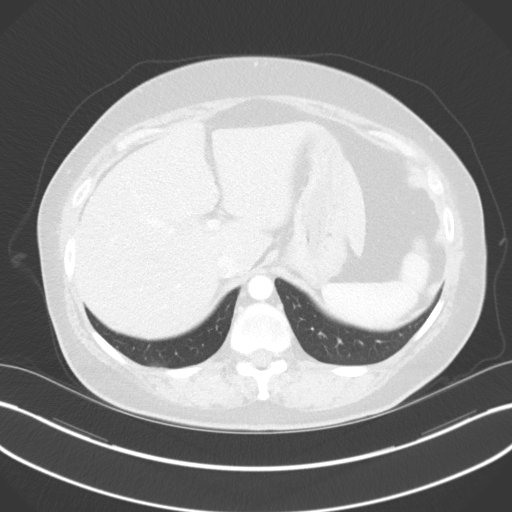
[im 14/63  lung]
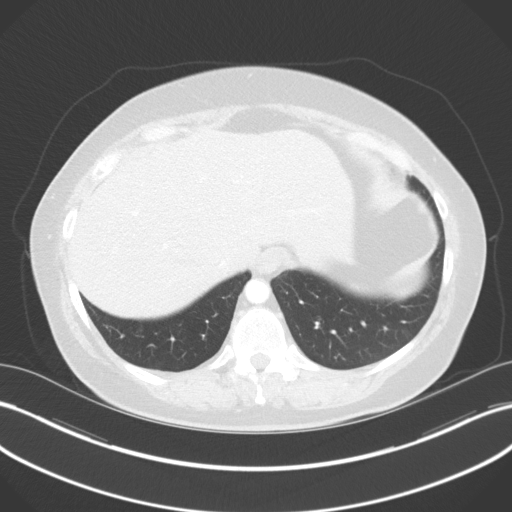
[im 19/63  lung]
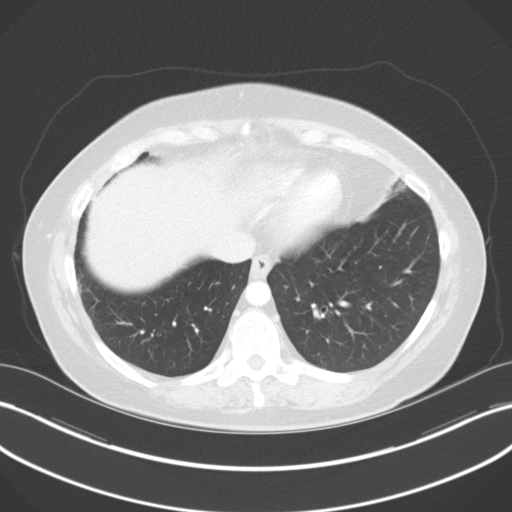
[im 23/63  mediastinal]
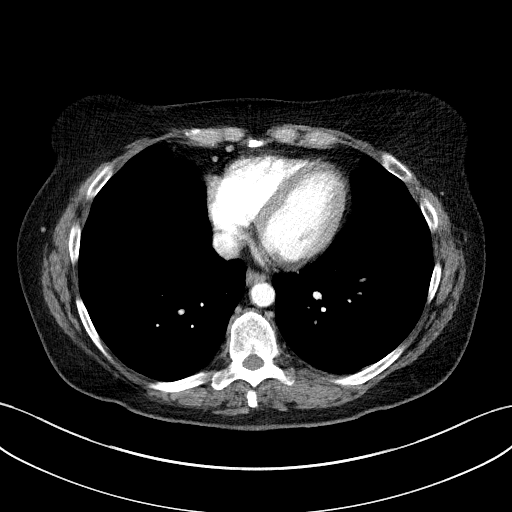
[im 23/63  lung]
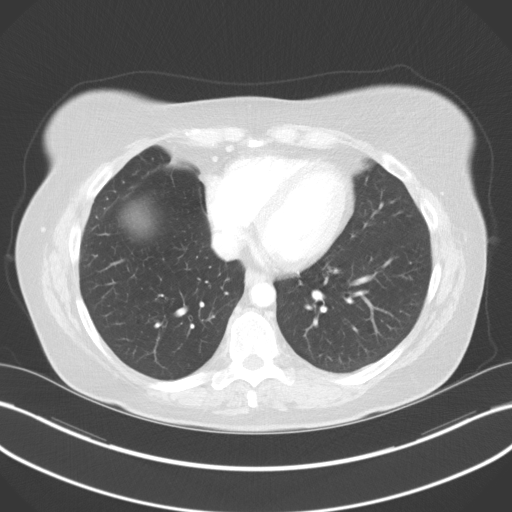
[im 28/63  lung]
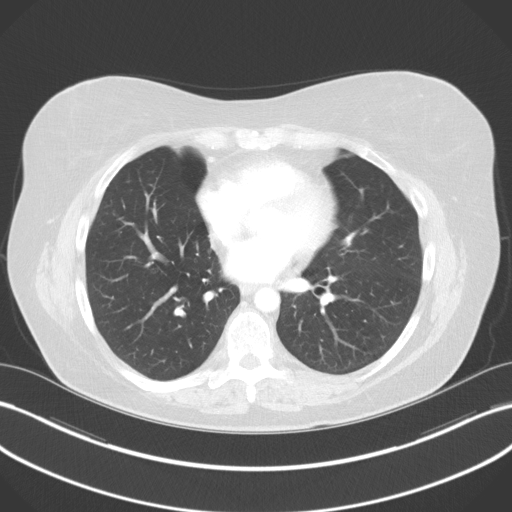
[im 35/63  lung]
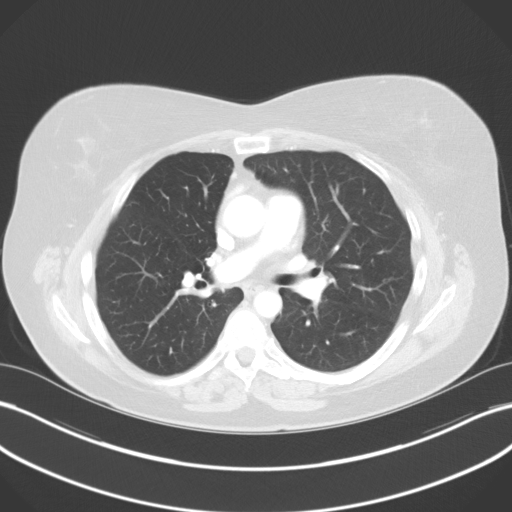
[im 40/63  lung]
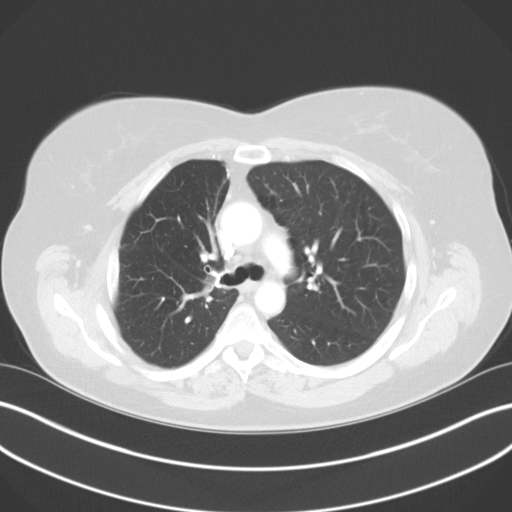
[im 44/63  mediastinal]
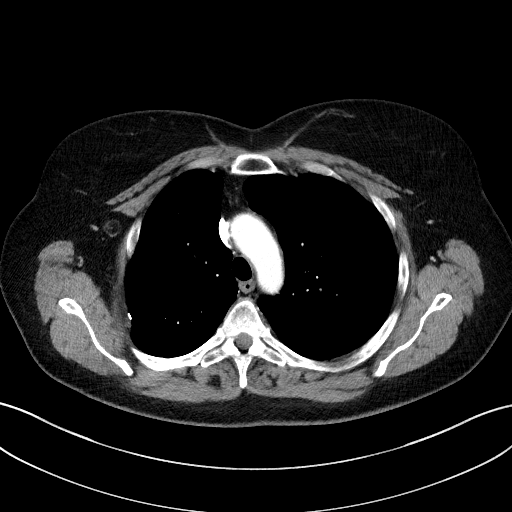
[im 44/63  lung]
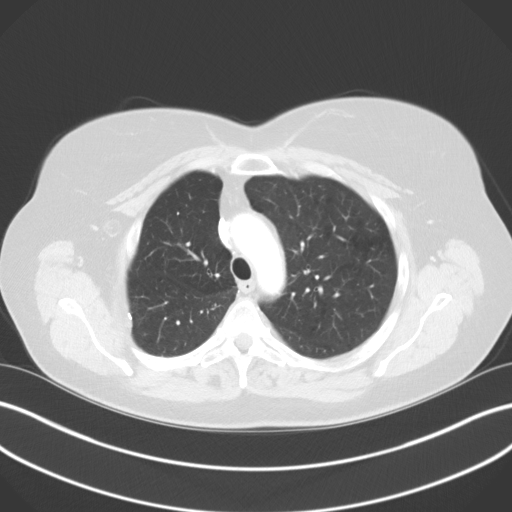
[im 49/63  lung]
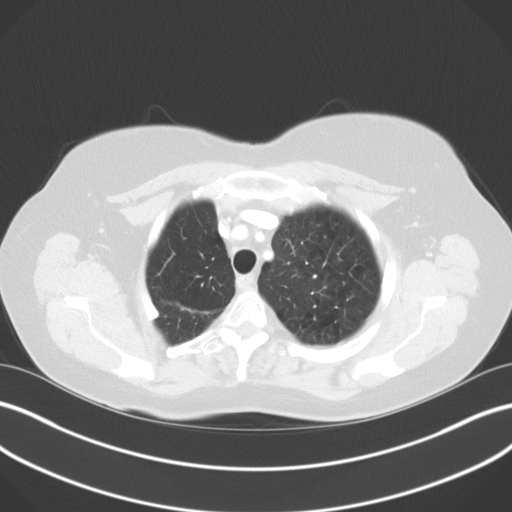
[im 53/63  lung]
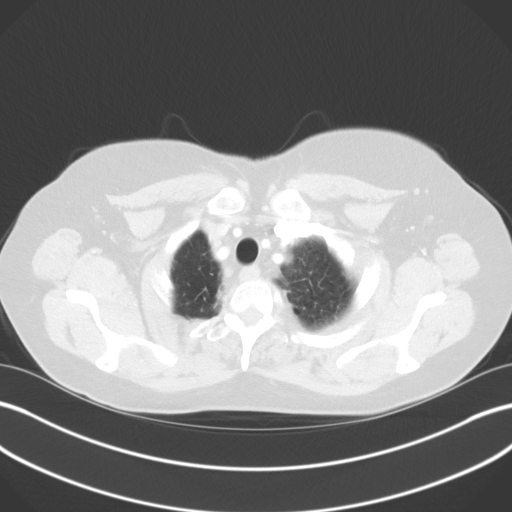
[im 58/63  lung]
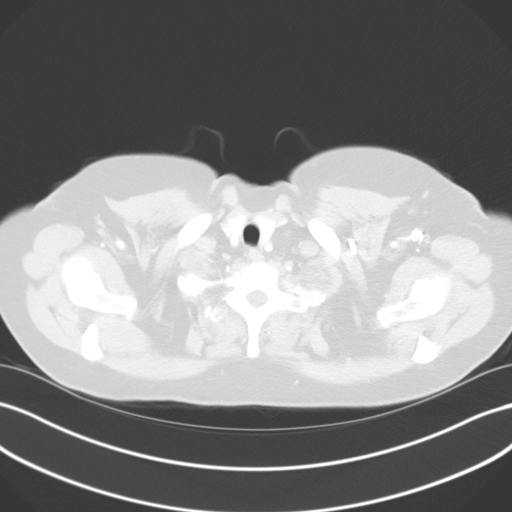

[Series 4: coronal · coronal · 0.62mm/px · 3 of 153 slices shown]
[im 31/153  lung]
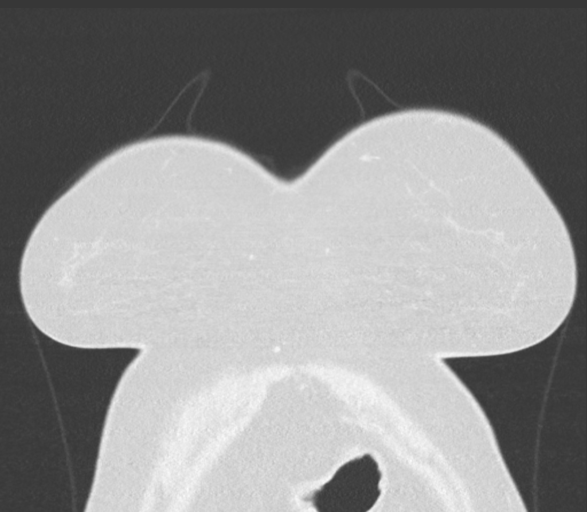
[im 61/153  lung]
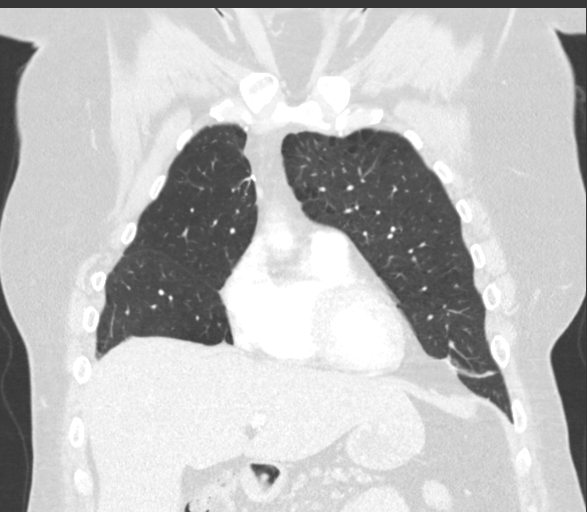
[im 92/153  lung]
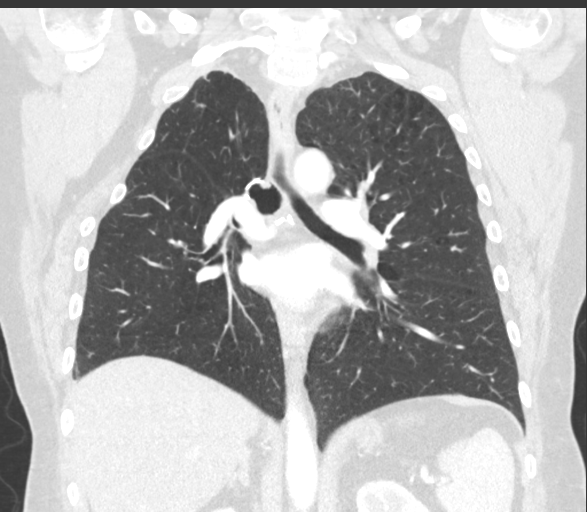

[15 of 36 positions shown; findings below may reference images not displayed]

FINDINGS: Cardiovascular: Heart size is normal. No pericardial effusion. No
aortic aneurysm or dissection. No central obstructing pulmonary
embolism seen.

Mediastinum/Nodes: No mass or enlarged lymph nodes within the
mediastinum or perihilar regions. Esophagus appears normal. Trachea
and central bronchi are unremarkable.

Lungs/Pleura: Stable postsurgical change at the right hilum and
within the right upper lobe. Lungs otherwise clear. No pleural
effusion or pneumothorax.

Emphysematous changes noted within the left lung apex. Mild chronic
pleuroparenchymal scarring/fibrosis noted at each lung apex.

Upper Abdomen: No acute findings. Status post cholecystectomy.
Hypodense mass within the upper pole of the right kidney, partially
imaged, compatible with benign cyst described on CT abdomen of
11/05/2015.

Musculoskeletal: No acute or suspicious osseous finding. Superficial
soft tissues are unremarkable.
IMPRESSION: 1. No acute findings. No source identified for shoulder, flank or
chest pain.
2. Stable appearance of the postsurgical change in the right chest.
3. Left upper lobe emphysematous change.

Emphysema ([id number]).

## 2017-07-14 ENCOUNTER — Encounter: Payer: Self-pay | Admitting: Family Medicine

## 2017-07-14 DIAGNOSIS — G894 Chronic pain syndrome: Secondary | ICD-10-CM

## 2017-07-24 ENCOUNTER — Encounter: Payer: Self-pay | Admitting: Family Medicine

## 2017-07-26 ENCOUNTER — Encounter: Payer: Self-pay | Admitting: Family Medicine

## 2017-08-02 ENCOUNTER — Telehealth: Payer: Self-pay | Admitting: Family Medicine

## 2017-08-02 MED ORDER — CLONAZEPAM 0.5 MG PO TABS: 0.5000 mg | ORAL_TABLET | Freq: Two times a day (BID) | ORAL | 1 refills | Status: DC | PRN

## 2017-08-02 MED ORDER — ATORVASTATIN CALCIUM 40 MG PO TABS: 40.0000 mg | ORAL_TABLET | Freq: Every day | ORAL | 3 refills | Status: DC

## 2017-08-02 MED ORDER — GABAPENTIN 300 MG PO CAPS: 600.0000 mg | ORAL_CAPSULE | Freq: Three times a day (TID) | ORAL | 3 refills | Status: DC

## 2017-08-02 NOTE — Telephone Encounter (Signed)
Medicine refill request received and sent to [facility]

## 2017-08-04 ENCOUNTER — Telehealth: Payer: Self-pay

## 2017-08-04 NOTE — Telephone Encounter (Signed)
ERROR. Yvonne Gonzales Yvonne Gonzales,CMA

## 2017-08-08 DIAGNOSIS — G4710 Hypersomnia, unspecified: Secondary | ICD-10-CM | POA: Diagnosis not present

## 2017-08-08 DIAGNOSIS — R69 Illness, unspecified: Secondary | ICD-10-CM | POA: Diagnosis not present

## 2017-08-08 DIAGNOSIS — G47419 Narcolepsy without cataplexy: Secondary | ICD-10-CM | POA: Diagnosis not present

## 2017-08-31 DIAGNOSIS — R0789 Other chest pain: Secondary | ICD-10-CM | POA: Diagnosis not present

## 2017-08-31 DIAGNOSIS — Z5181 Encounter for therapeutic drug level monitoring: Secondary | ICD-10-CM | POA: Diagnosis not present

## 2017-08-31 DIAGNOSIS — M25511 Pain in right shoulder: Secondary | ICD-10-CM | POA: Diagnosis not present

## 2017-08-31 DIAGNOSIS — G893 Neoplasm related pain (acute) (chronic): Secondary | ICD-10-CM | POA: Diagnosis not present

## 2017-08-31 DIAGNOSIS — G894 Chronic pain syndrome: Secondary | ICD-10-CM | POA: Diagnosis not present

## 2017-08-31 DIAGNOSIS — Z79899 Other long term (current) drug therapy: Secondary | ICD-10-CM | POA: Diagnosis not present

## 2017-09-13 ENCOUNTER — Encounter: Payer: Self-pay | Admitting: Family Medicine

## 2017-09-13 ENCOUNTER — Ambulatory Visit (INDEPENDENT_AMBULATORY_CARE_PROVIDER_SITE_OTHER): Payer: Medicare HMO | Admitting: Family Medicine

## 2017-09-13 VITALS — BP 113/79 | HR 127 | Ht 65.0 in | Wt 165.0 lb

## 2017-09-13 DIAGNOSIS — Z85118 Personal history of other malignant neoplasm of bronchus and lung: Secondary | ICD-10-CM | POA: Diagnosis not present

## 2017-09-13 DIAGNOSIS — G92 Toxic encephalopathy: Secondary | ICD-10-CM | POA: Diagnosis not present

## 2017-09-13 DIAGNOSIS — E782 Mixed hyperlipidemia: Secondary | ICD-10-CM

## 2017-09-13 DIAGNOSIS — I10 Essential (primary) hypertension: Secondary | ICD-10-CM | POA: Diagnosis not present

## 2017-09-13 DIAGNOSIS — D229 Melanocytic nevi, unspecified: Secondary | ICD-10-CM | POA: Diagnosis not present

## 2017-09-13 DIAGNOSIS — G929 Unspecified toxic encephalopathy: Secondary | ICD-10-CM

## 2017-09-13 DIAGNOSIS — R000 Tachycardia, unspecified: Secondary | ICD-10-CM | POA: Diagnosis not present

## 2017-09-13 MED ORDER — DONEPEZIL HCL 10 MG PO TABS: 10.0000 mg | ORAL_TABLET | Freq: Every day | ORAL | 1 refills | Status: DC

## 2017-09-13 NOTE — Patient Instructions (Addendum)
Thank you for coming in today. Restart Aricept at night.  Reschedule for removal of the skin mole thing.  Return soonish.  Get fasting labs soon.   Recheck in 6 months or sooner if needed.

## 2017-09-13 NOTE — Progress Notes (Signed)
Yvonne Gonzales is a 57 y.o. female who presents to [place] today for hyperpigmented skin lesion, mental fogginess, tachycardia, lipids.  Yvonne Gonzales has a history of mental fogginess following her lung cancer treatment.  She notes that it has worsened slightly without any particular underlying causal factor that she is aware of.  Denies any head injury fevers or chills nausea vomiting or diarrhea.  In the past she had a trial of Aricept but had some weird dreams with it.  She notes at this point she is interested in a repeat trial if possible.  Additionally she has tachycardia.  She notes this is been ongoing now for quite a while since her lung surgery.  Her understanding is that she suffered a vagus nerve injury and has persistent sinus tachycardia.  She denies any chest pain or palpitations or shortness of breath.  She is able to exert herself quite well.  Skin moles: Patient notes a hyperpigmented skin lesion on her left shoulder.  She is not sure how long it has been there thinks is pretty new.  It is not particularly bothersome or painful.  Additionally she notes multiple fleshy small growths on her face that she finds to be distressing and sometimes irritated.  She has had some of these removed previously and is interested in removal if possible.  Lipids: Yvonne Gonzales takes atorvastatin daily and tolerates it well without significant muscle aches or pains.   Past Medical History:  Diagnosis Date  . Acute pancreatitis   . Arthritis   . Cancer ([place]) Lung (right side)  . Chronic shoulder pain 07/25/2013   [facility] since cancer treatment.   . Colon polyps   . Complication of anesthesia    prolonged sedation  . Depression 07/25/2013  . Dyspnea    with exertion  . Generalized anxiety disorder 07/25/2013  . GERD (gastroesophageal reflux disease)   . Hyperlipidemia  07/25/2013  . Memory loss   . Mild cognitive impairment 07/25/2013   [facility] - thought to be secondary to cancer treatment   . Narcolepsy   . Nerve pain    right shoulder, side  . Pancreatic rests in stomach   . Toxic encephalopathy    Past Surgical History:  Procedure Laterality Date  . CHOLECYSTECTOMY    . COLONOSCOPY    . HYSTEROSCOPY W/D&C N/A 03/24/2016   Procedure: DILATATION AND CURETTAGE /HYSTEROSCOPY;  Surgeon: Emily Filbert, MD;  Location: [facility];  Service: Gynecology;  Laterality: N/A;  . LUNG CANCER SURGERY Right    pancost tumor  . LUNG REMOVAL, PARTIAL Right   . lymphectomy    . ribs removed, right side     chest wall removal  . TONSILLECTOMY    . TUBAL LIGATION    . UPPER ESOPHAGEAL ENDOSCOPIC ULTRASOUND (EUS)     Social History   Tobacco Use  . Smoking status: Former Smoker    Packs/day: 1.50    Years: 35.00    Pack years: 52.50    Last attempt to quit: 04/16/2007    Years since quitting: 10.4  . Smokeless tobacco: Never Used  Substance Use Topics  . Alcohol use: Yes    Alcohol/week: 0.0 oz    Comment: rare   family history includes Breast cancer (age of onset: 82) in her mother; Cancer in her father and mother.  ROS as above:  Medications: Current Outpatient Medications  Medication Sig Dispense Refill  .  amitriptyline (ELAVIL) 25 MG tablet Take 1 tablet (25 mg total) by mouth at bedtime. 90 tablet 3  . atorvastatin (LIPITOR) 40 MG tablet Take 1 tablet (40 mg total) by mouth daily. 90 tablet 3  . cholecalciferol (VITAMIN D) 1000 UNITS tablet Take 5,000 Units by mouth daily.     . clonazePAM (KLONOPIN) 0.5 MG tablet Take 1 tablet (0.5 mg total) by mouth 2 (two) times daily as needed. 120 tablet 1  . hydrocortisone (ANUSOL-HC) 25 MG suppository Place 1 suppository (25 mg total) rectally 2 (two) times daily. Only PRN rectal irritation. 12 suppository 11  . Krill Oil 300 MG CAPS Take 1 capsule by mouth every morning.     .  methylphenidate (RITALIN) 20 MG tablet Take 10 mg by mouth 2 (two) times daily.     Marland Kitchen morphine (MS CONTIN) 15 MG 12 hr tablet Take 15 mg by mouth daily as needed for pain.     Marland Kitchen morphine (MS CONTIN) 30 MG 12 hr tablet Take 30 mg by mouth every 12 (twelve) hours.     . Multiple Vitamin (MULTIVITAMIN) tablet Take 1 tablet by mouth daily.    Marland Kitchen triamcinolone (KENALOG) 0.1 % paste Apply to corners of mouth BID as needed. 5 g 12  . donepezil (ARICEPT) 10 MG tablet Take 1 tablet (10 mg total) by mouth at bedtime. 90 tablet 1   No current facility-administered medications for this visit.    Allergies  Allergen Reactions  . Latex Hives    Red rash  . Amphetamine-Dextroamphetamine     HA  . Diclofenac Sodium     Voltaren  . Tape   . Tolmetin Nausea And Vomiting    NSAID High doses only NSAID NSAID    Health Maintenance Health Maintenance  Topic Date Due  . INFLUENZA VACCINE  04/06/2018 (Originally 12/22/2017)  . MAMMOGRAM  12/14/2018  . COLONOSCOPY  02/26/2021  . TETANUS/TDAP  12/29/2025  . Hepatitis C Screening  Completed  . HIV Screening  Completed     Exam:  BP 113/79   Pulse (!) 127   Ht 5\' 5"  (1.651 m)   Wt 165 lb (74.8 kg)   SpO2 94%   BMI 27.46 kg/m  Gen: Well NAD HEENT: EOMI,  MMM Lungs: Normal work of breathing. CTABL Heart: tachy but regular  no MRG heart rate 110 bpm per my check Abd: NABS, Soft. Nondistended, Nontender Exts: Brisk capillary refill, warm and well perfused.  Skin: 1 small 3 mm hyperpigmented keratinous appearing skin lesion on left posterior shoulder.  Nontender. Several small less than 5 mm fleshy lesions on chin and face Psych: Alert and oriented normal speech thought process and affect.  No SI or HI expressed.  Will lead EKG shows sinus tachycardia at 112 bpm with no ST segment elevation or depression.  No Q waves.  Normal T waves.  Sinus tachycardia otherwise normal EKG.  No results found for this or any previous visit (from the past 72  hour(s)). No results found.    Assessment and Plan: 57 y.o. female with  Mental Fog: Ongoing chronic issue.  Reasonable for trial of Aricept.  Check basic fasting labs listed below.  Tachycardia: Sinus on EKG.  Consistent with prior history of slightly worse today.  Plan for watchful waiting.  Recheck if not improving.  Metabolic panel pending.  Hyperpigmented skin lesion left shoulder: Unclear etiology.  This point reasonable for excisional biopsy.  Patient will reschedule for the near future.  Fleshy colored  skin lesions on face: Reasonable to refer to dermatology for removal.  Patient is having symptoms however with irritation.  Lipids: Recheck fasting lipid panel in the near future with fasting labs listed below.  Orders Placed This Encounter  Procedures  . CBC  . COMPLETE METABOLIC PANEL WITH GFR  . Lipid Panel w/reflex Direct LDL  . Ambulatory referral to Dermatology    Referral Priority:   Routine    Referral Type:   Consultation    Referral Reason:   Specialty Services Required    Requested Specialty:   Dermatology    Number of Visits Requested:   1   Meds ordered this encounter  Medications  . donepezil (ARICEPT) 10 MG tablet    Sig: Take 1 tablet (10 mg total) by mouth at bedtime.    Dispense:  90 tablet    Refill:  1     Discussed warning signs or symptoms. Please see discharge instructions. Patient expresses understanding.

## 2017-09-15 ENCOUNTER — Ambulatory Visit (INDEPENDENT_AMBULATORY_CARE_PROVIDER_SITE_OTHER): Payer: Medicare HMO | Admitting: Family Medicine

## 2017-09-15 ENCOUNTER — Encounter: Payer: Self-pay | Admitting: Family Medicine

## 2017-09-15 VITALS — BP 93/69 | HR 96 | Ht 65.0 in | Wt 166.0 lb

## 2017-09-15 DIAGNOSIS — K644 Residual hemorrhoidal skin tags: Secondary | ICD-10-CM

## 2017-09-15 DIAGNOSIS — R000 Tachycardia, unspecified: Secondary | ICD-10-CM | POA: Diagnosis not present

## 2017-09-15 DIAGNOSIS — I10 Essential (primary) hypertension: Secondary | ICD-10-CM | POA: Diagnosis not present

## 2017-09-15 DIAGNOSIS — L989 Disorder of the skin and subcutaneous tissue, unspecified: Secondary | ICD-10-CM | POA: Diagnosis not present

## 2017-09-15 DIAGNOSIS — L821 Other seborrheic keratosis: Secondary | ICD-10-CM | POA: Diagnosis not present

## 2017-09-15 LAB — COMPLETE METABOLIC PANEL WITH GFR
AG RATIO: 1.4 (calc) (ref 1.0–2.5)
ALKALINE PHOSPHATASE (APISO): 100 U/L (ref 33–130)
ALT: 10 U/L (ref 6–29)
AST: 12 U/L (ref 10–35)
Albumin: 4.1 g/dL (ref 3.6–5.1)
BUN: 9 mg/dL (ref 7–25)
CO2: 31 mmol/L (ref 20–32)
Calcium: 9.6 mg/dL (ref 8.6–10.4)
Chloride: 105 mmol/L (ref 98–110)
Creat: 0.81 mg/dL (ref 0.50–1.05)
GFR, EST NON AFRICAN AMERICAN: 81 mL/min/{1.73_m2} (ref >=60)
GFR, Est African American: 94 mL/min/{1.73_m2} (ref >=60)
GLOBULIN: 2.9 g/dL (ref 1.9–3.7)
Glucose, Bld: 114 mg/dL — ABNORMAL HIGH (ref 65–99)
POTASSIUM: 4.1 mmol/L (ref 3.5–5.3)
SODIUM: 142 mmol/L (ref 135–146)
Total Bilirubin: 0.7 mg/dL (ref 0.2–1.2)
Total Protein: 7 g/dL (ref 6.1–8.1)

## 2017-09-15 LAB — CBC
HEMATOCRIT: 40.7 % (ref 35.0–45.0)
Hemoglobin: 13.8 g/dL (ref 11.7–15.5)
MCH: 29.1 pg (ref 27.0–33.0)
MCHC: 33.9 g/dL (ref 32.0–36.0)
MCV: 85.7 fL (ref 80.0–100.0)
MPV: 10.8 fL (ref 7.5–12.5)
Platelets: 275 10*3/uL (ref 140–400)
RBC: 4.75 10*6/uL (ref 3.80–5.10)
RDW: 11.7 % (ref 11.0–15.0)
WBC: 7 10*3/uL (ref 3.8–10.8)

## 2017-09-15 LAB — LIPID PANEL W/REFLEX DIRECT LDL
CHOLESTEROL: 124 mg/dL (ref <200)
HDL: 34 mg/dL — ABNORMAL LOW (ref >50)
LDL Cholesterol (Calc): 67 mg/dL (calc)
NON-HDL CHOLESTEROL (CALC): 90 mg/dL (ref <130)
Total CHOL/HDL Ratio: 3.6 (calc) (ref <5.0)
Triglycerides: 146 mg/dL (ref <150)

## 2017-09-15 MED ORDER — HYDROCORTISONE ACETATE 25 MG RE SUPP: 25.0000 mg | Freq: Two times a day (BID) | RECTAL | 11 refills | Status: DC

## 2017-09-15 NOTE — Progress Notes (Signed)
Skin Biopsy:  Joby presents to clinic today for previously arranged skin biopsy.  Karren was seen on the 23rd where she pointed out a new hyperpigmented skin lesion on her left posterior shoulder.  She was asked to schedule a dedicated visit for skin biopsy.  Excisional punch biopsy: Skin examined with no surrounding erythema or induration. Consent obtained and timeout performed. Skin cleaned with rubbing alcohol cold spray applied, 1.5 mL of lidocaine with epinephrine injected achieving good anesthesia. Skin then cleaned with chlorhexidine prepped and draped in the usual sterile fashion. 6 mm punch biopsy used to remove the entire lesion with at least 1 mm margin. The punch biopsy skin was pulled free and snipped with scissors deep. The skin edges were undermined and a horizontal mattress suture using 4-0 Prolene was used to close the wound. Petroleum jelly and dressing was applied. Patient tolerated the procedure well.  Of note Anusol refilled.

## 2017-09-15 NOTE — Patient Instructions (Addendum)
Thank you for coming in today. Schedule a nurse visit for about 1 week for suture removal.  Return as needed.  Keep the wound covered with ointment.  Let me know if you have a lot of pain or skin redness.

## 2017-09-15 NOTE — Addendum Note (Signed)
Addended by: Huel Cote on: 09/15/2017 09:50 AM   Modules accepted: Orders

## 2017-09-16 ENCOUNTER — Encounter: Payer: Self-pay | Admitting: Family Medicine

## 2017-09-16 DIAGNOSIS — R7301 Impaired fasting glucose: Secondary | ICD-10-CM | POA: Insufficient documentation

## 2017-09-18 ENCOUNTER — Encounter: Payer: Self-pay | Admitting: Family Medicine

## 2017-09-19 ENCOUNTER — Encounter: Payer: Self-pay | Admitting: Family Medicine

## 2017-09-20 MED ORDER — DONEPEZIL HCL 10 MG PO TABS: 10.0000 mg | ORAL_TABLET | Freq: Every day | ORAL | 1 refills | Status: DC

## 2017-09-22 ENCOUNTER — Ambulatory Visit (INDEPENDENT_AMBULATORY_CARE_PROVIDER_SITE_OTHER): Payer: Medicare HMO | Admitting: Sports Medicine

## 2017-09-22 VITALS — BP 139/83 | HR 121 | Wt 165.0 lb

## 2017-09-22 DIAGNOSIS — L989 Disorder of the skin and subcutaneous tissue, unspecified: Secondary | ICD-10-CM

## 2017-09-22 NOTE — Progress Notes (Signed)
   Subjective:    Patient ID: Yvonne Gonzales, female    DOB: Jan 22, 1961, 57 y.o.   MRN: [id number]  HPI  Yvonne Gonzales is here for a suture removal of the left shoulder. Her pulse is elevated. She states her pulse has been elevated since her lung surgery. She has been worked up for the elevated pulse. Denies fever, chills, sweats, chest pain, shortness of breath or dizziness.   Review of Systems     Objective:   Physical Exam        Assessment & Plan:  Suture removal - Wound is healing well, no redness or swelling.

## 2017-10-26 DIAGNOSIS — G893 Neoplasm related pain (acute) (chronic): Secondary | ICD-10-CM | POA: Diagnosis not present

## 2017-10-26 DIAGNOSIS — G894 Chronic pain syndrome: Secondary | ICD-10-CM | POA: Diagnosis not present

## 2017-10-26 DIAGNOSIS — R69 Illness, unspecified: Secondary | ICD-10-CM | POA: Diagnosis not present

## 2017-10-26 DIAGNOSIS — M898X1 Other specified disorders of bone, shoulder: Secondary | ICD-10-CM | POA: Diagnosis not present

## 2017-11-30 DIAGNOSIS — R0789 Other chest pain: Secondary | ICD-10-CM | POA: Diagnosis not present

## 2017-11-30 DIAGNOSIS — G893 Neoplasm related pain (acute) (chronic): Secondary | ICD-10-CM | POA: Diagnosis not present

## 2017-11-30 DIAGNOSIS — M79601 Pain in right arm: Secondary | ICD-10-CM | POA: Diagnosis not present

## 2017-11-30 DIAGNOSIS — G894 Chronic pain syndrome: Secondary | ICD-10-CM | POA: Diagnosis not present

## 2017-12-23 DIAGNOSIS — M79601 Pain in right arm: Secondary | ICD-10-CM | POA: Diagnosis not present

## 2017-12-23 DIAGNOSIS — G893 Neoplasm related pain (acute) (chronic): Secondary | ICD-10-CM | POA: Diagnosis not present

## 2017-12-23 DIAGNOSIS — G894 Chronic pain syndrome: Secondary | ICD-10-CM | POA: Diagnosis not present

## 2017-12-23 DIAGNOSIS — R0789 Other chest pain: Secondary | ICD-10-CM | POA: Diagnosis not present

## 2018-01-13 DIAGNOSIS — R0789 Other chest pain: Secondary | ICD-10-CM | POA: Diagnosis not present

## 2018-01-13 DIAGNOSIS — G893 Neoplasm related pain (acute) (chronic): Secondary | ICD-10-CM | POA: Diagnosis not present

## 2018-02-08 DIAGNOSIS — G894 Chronic pain syndrome: Secondary | ICD-10-CM | POA: Diagnosis not present

## 2018-02-08 DIAGNOSIS — R69 Illness, unspecified: Secondary | ICD-10-CM | POA: Diagnosis not present

## 2018-02-27 DIAGNOSIS — G894 Chronic pain syndrome: Secondary | ICD-10-CM | POA: Diagnosis not present

## 2018-02-27 DIAGNOSIS — R0789 Other chest pain: Secondary | ICD-10-CM | POA: Diagnosis not present

## 2018-02-27 DIAGNOSIS — M898X1 Other specified disorders of bone, shoulder: Secondary | ICD-10-CM | POA: Diagnosis not present

## 2018-02-27 DIAGNOSIS — G893 Neoplasm related pain (acute) (chronic): Secondary | ICD-10-CM | POA: Diagnosis not present

## 2018-03-15 ENCOUNTER — Ambulatory Visit (INDEPENDENT_AMBULATORY_CARE_PROVIDER_SITE_OTHER): Payer: Medicare HMO | Admitting: Family Medicine

## 2018-03-15 ENCOUNTER — Encounter: Payer: Self-pay | Admitting: Family Medicine

## 2018-03-15 VITALS — BP 136/76 | HR 98 | Ht 65.0 in | Wt 176.0 lb

## 2018-03-15 DIAGNOSIS — K130 Diseases of lips: Secondary | ICD-10-CM

## 2018-03-15 DIAGNOSIS — R928 Other abnormal and inconclusive findings on diagnostic imaging of breast: Secondary | ICD-10-CM

## 2018-03-15 DIAGNOSIS — Z0000 Encounter for general adult medical examination without abnormal findings: Secondary | ICD-10-CM | POA: Diagnosis not present

## 2018-03-15 DIAGNOSIS — Z6829 Body mass index (BMI) 29.0-29.9, adult: Secondary | ICD-10-CM

## 2018-03-15 MED ORDER — TRIAMCINOLONE ACETONIDE 0.1 % MT PSTE: PASTE | OROMUCOSAL | 12 refills | Status: AC

## 2018-03-15 NOTE — Patient Instructions (Signed)
Thank you for coming in today. You should hear about mammogram soon.  Recheck with me in 6 months.  Please schedule the nurse medicare well visit soon.

## 2018-03-15 NOTE — Progress Notes (Signed)
Yvonne Gonzales is a 57 y.o. female who presents to [facility] today for well adult visit.   She is doing well overall.  She notes that she has been able to wean herself off of chronic opiates and is using intermittent opiates now.  She maintains physical activity and fitness.  She is trying to eat and improve diet to lose weight.  She feels happy with how things are going.  She denies fevers chills nausea vomiting or diarrhea.   ROS as above:  Past Medical History:  Diagnosis Date  . Acute pancreatitis   . Arthritis   . Cancer ([place]) Lung (right side)  . Chronic shoulder pain 07/25/2013   [facility] since cancer treatment.   . Colon polyps   . Complication of anesthesia    prolonged sedation  . Depression 07/25/2013  . Dyspnea    with exertion  . Generalized anxiety disorder 07/25/2013  . GERD (gastroesophageal reflux disease)   . Hyperlipidemia 07/25/2013  . Memory loss   . Mild cognitive impairment 07/25/2013   [facility] - thought to be secondary to cancer treatment   . Narcolepsy   . Nerve pain    right shoulder, side  . Pancreatic rests in stomach   . Toxic encephalopathy    Past Surgical History:  Procedure Laterality Date  . CHOLECYSTECTOMY    . COLONOSCOPY    . HYSTEROSCOPY W/D&C N/A 03/24/2016   Procedure: DILATATION AND CURETTAGE /HYSTEROSCOPY;  Surgeon: Emily Filbert, MD;  Location: [facility];  Service: Gynecology;  Laterality: N/A;  . LUNG CANCER SURGERY Right    pancost tumor  . LUNG REMOVAL, PARTIAL Right   . lymphectomy    . ribs removed, right side     chest wall removal  . TONSILLECTOMY    . TUBAL LIGATION    . UPPER ESOPHAGEAL ENDOSCOPIC ULTRASOUND (EUS)     Social History   Tobacco Use  . Smoking status: Former Smoker    Packs/day: 1.50    Years: 35.00    Pack years: 52.50    Last attempt to quit:  04/16/2007    Years since quitting: 10.9  . Smokeless tobacco: Never Used  Substance Use Topics  . Alcohol use: Yes    Alcohol/week: 0.0 standard drinks    Comment: rare   family history includes Breast cancer (age of onset: 56) in her mother; Cancer in her father and mother.  Medications: Current Outpatient Medications  Medication Sig Dispense Refill  . amitriptyline (ELAVIL) 25 MG tablet Take 1 tablet (25 mg total) by mouth at bedtime. 90 tablet 3  . atorvastatin (LIPITOR) 40 MG tablet Take 1 tablet (40 mg total) by mouth daily. 90 tablet 3  . donepezil (ARICEPT) 10 MG tablet Take 1 tablet (10 mg total) by mouth at bedtime. 90 tablet 1  . Krill Oil 1000 MG CAPS Take 1 capsule by mouth daily.    . Multiple Vitamin (MULTIVITAMIN) tablet Take 1 tablet by mouth daily.    Marland Kitchen triamcinolone (KENALOG) 0.1 % paste Apply to corners of mouth BID as needed. 5 g 12  . NUCYNTA 50 MG tablet Take 1 tablet by mouth 2 (two) times daily as needed.    Marland Kitchen tiZANidine (ZANAFLEX) 4 MG tablet Take 1 tablet by mouth 3 (three) times daily as needed. Moslty at night  2   No current facility-administered medications for this visit.    Allergies  Allergen Reactions  . Latex Hives    Red rash  . Amphetamine-Dextroamphetamine     HA  . Diclofenac Sodium     Voltaren  . Tape   . Tolmetin Nausea And Vomiting    NSAID High doses only NSAID NSAID    Health Maintenance Health Maintenance  Topic Date Due  . INFLUENZA VACCINE  12/14/2018 (Originally 12/22/2017)  . MAMMOGRAM  12/14/2018  . COLONOSCOPY  02/26/2021  . TETANUS/TDAP  12/29/2025  . Hepatitis C Screening  Completed  . HIV Screening  Completed     Exam:  BP 136/76   Pulse 98   Ht 5\' 5"  (1.651 m)   Wt 176 lb (79.8 kg)   BMI 29.29 kg/m  Wt Readings from Last 5 Encounters:  03/15/18 176 lb (79.8 kg)  09/22/17 165 lb (74.8 kg)  09/15/17 166 lb (75.3 kg)  09/13/17 165 lb (74.8 kg)  05/12/17 179 lb (81.2 kg)      Gen: Well NAD HEENT:  EOMI,  MMM Lungs: Normal work of breathing. CTABL Heart: RRR no MRG Abd: NABS, Soft. Nondistended, Nontender Exts: Brisk capillary refill, warm and well perfused.  Psych: Alert and oriented normal speech thought process and affect.  Depression screen [facility] 2/9 03/15/2018 09/13/2017 05/31/2016  Decreased Interest 0 0 0  Down, Depressed, Hopeless 0 1 1  PHQ - 2 Score 0 1 1  Altered sleeping 0 0 0  Tired, decreased energy 0 0 1  Change in appetite 0 1 0  Feeling bad or failure about yourself  0 0 0  Trouble concentrating 0 2 1  Moving slowly or fidgety/restless 0 0 0  Suicidal thoughts 0 0 0  PHQ-9 Score 0 4 3  Difficult doing work/chores Not difficult at all Somewhat difficult -        Assessment and Plan: 57 y.o. female with well adult visit.  Doing well.  Screening tests up-to-date.  Vaccines are up-to-date as well.  Plan to continue current regimen and reassess basic fasting labs in 6 months.  Patient will schedule with nurse for Medicare wellness visit in the near future.  Repeat mammogram ordered.  Patient had screening mammogram last year and was a little abnormal and asked to repeat diagnostic mammogram this year.  Spent time discussing diet and exercise.   Orders Placed This Encounter  Procedures  . MM Digital Diagnostic Bilat    Standing Status:   Future    Standing Expiration Date:   05/16/2019    Order Specific Question:   Reason for Exam (SYMPTOM  OR DIAGNOSIS REQUIRED)    Answer:   Need f/u diagnostic mammo    Order Specific Question:   Is the patient pregnant?    Answer:   No    Order Specific Question:   Preferred imaging location?    Answer:   [facility]   Meds ordered this encounter  Medications  . triamcinolone (KENALOG) 0.1 % paste    Sig: Apply to corners of mouth BID as needed.    Dispense:  5 g    Refill:  12     Discussed warning signs or symptoms. Please see discharge instructions. Patient expresses understanding.

## 2018-03-16 ENCOUNTER — Encounter: Payer: Self-pay | Admitting: Family Medicine

## 2018-04-17 ENCOUNTER — Ambulatory Visit: Payer: Medicare HMO

## 2018-04-24 ENCOUNTER — Ambulatory Visit (INDEPENDENT_AMBULATORY_CARE_PROVIDER_SITE_OTHER): Payer: Medicare HMO | Admitting: *Deleted

## 2018-04-24 VITALS — BP 136/81 | HR 106 | Ht 65.0 in | Wt 181.0 lb

## 2018-04-24 DIAGNOSIS — Z0000 Encounter for general adult medical examination without abnormal findings: Secondary | ICD-10-CM

## 2018-04-24 NOTE — Progress Notes (Signed)
Subjective:   Yvonne Gonzales is a 57 y.o. female who presents for an Initial Medicare Annual Wellness Visit.  Review of Systems    No ROS.  Medicare Wellness Visit. Additional risk factors are reflected in the social history.   Cardiac Risk Factors include: hypertension;sedentary lifestyle Sleep patterns:Getting between 6-7 hours of sleep a night . Does take Elavil 25mg  1/2 tab at bedtime. Feels rested most of the time upon wakening. Does not wake up to go to bathroom.    Home Safety/Smoke Alarms: Feels safe in home. Smoke alarms in place.  Living environment; Lives with husband and dog with no stairs. Shower is a step over tub no grab bars in place.    Female:   Pap-  Hysterectomy- declines    Mammo-  ordered     Dexa scan- does not qualify yet       CCS-utd     Objective:    Today's Vitals   04/24/18 1608  BP: 136/81  Pulse: (!) 106  SpO2: 99%  Weight: 181 lb (82.1 kg)  Height: 5\' 5"  (1.651 m)  PainSc: 2    Body mass index is 30.12 kg/m.  Advanced Directives 04/24/2018 03/15/2016 10/05/2013  Does Patient Have a Medical Advance Directive? No No Patient does not have advance directive;Patient would not like information  Would patient like information on creating a medical advance directive? No - Patient declined Yes - Educational materials given -    Current Medications (verified) Outpatient Encounter Medications as of 04/24/2018  Medication Sig  . amitriptyline (ELAVIL) 25 MG tablet Take 1 tablet (25 mg total) by mouth at bedtime.  Marland Kitchen atorvastatin (LIPITOR) 40 MG tablet Take 1 tablet (40 mg total) by mouth daily.  Marland Kitchen donepezil (ARICEPT) 10 MG tablet Take 1 tablet (10 mg total) by mouth at bedtime.  [name] Oil 1000 MG CAPS Take 1 capsule by mouth daily.  . Multiple Vitamin (MULTIVITAMIN) tablet Take 1 tablet by mouth daily.  . NUCYNTA 50 MG tablet Take 1 tablet by mouth 2 (two) times daily as needed.  Marland Kitchen tiZANidine (ZANAFLEX) 4 MG tablet Take 1 tablet by mouth 3 (three)  times daily as needed. Moslty at night  . triamcinolone (KENALOG) 0.1 % paste Apply to corners of mouth BID as needed.   No facility-administered encounter medications on file as of 04/24/2018.     Allergies (verified) Latex; Amphetamine-dextroamphetamine; Diclofenac sodium; Tape; and Tolmetin   History: Past Medical History:  Diagnosis Date  . Acute pancreatitis   . Arthritis   . Cancer ([place]) Lung (right side)  . Chronic shoulder pain 07/25/2013   [facility] since cancer treatment.   . Colon polyps   . Complication of anesthesia    prolonged sedation  . Depression 07/25/2013  . Dyspnea    with exertion  . Generalized anxiety disorder 07/25/2013  . GERD (gastroesophageal reflux disease)   . Hyperlipidemia 07/25/2013  . Memory loss   . Mild cognitive impairment 07/25/2013   [facility] - thought to be secondary to cancer treatment   . Narcolepsy   . Nerve pain    right shoulder, side  . Pancreatic rests in stomach   . Toxic encephalopathy    Past Surgical History:  Procedure Laterality Date  . CHOLECYSTECTOMY    . COLONOSCOPY    . HYSTEROSCOPY W/D&C N/A 03/24/2016   Procedure: DILATATION AND CURETTAGE /HYSTEROSCOPY;  Surgeon: Emily Filbert, MD;  Location: [facility];  Service: Gynecology;  Laterality: N/A;  .  LUNG CANCER SURGERY Right    pancost tumor  . LUNG REMOVAL, PARTIAL Right   . lymphectomy    . ribs removed, right side     chest wall removal  . TONSILLECTOMY    . TUBAL LIGATION    . UPPER ESOPHAGEAL ENDOSCOPIC ULTRASOUND (EUS)     Family History  Problem Relation Age of Onset  . Cancer Mother        breast  . Breast cancer Mother 62       Recurrence age 22  . Cancer Father        lung,brain,bone   Social History   Socioeconomic History  . Marital status: Married    Spouse name: [name]  . Number of children: 2  . Years of education: 3  . Highest education level: 12th grade  Occupational History  . Occupation: disability     Comment: Pharmacist, hospital  Social Needs  . Financial resource strain: Not hard at all  . Food insecurity:    Worry: Never true    Inability: Never true  . Transportation needs:    Medical: No    Non-medical: No  Tobacco Use  . Smoking status: Former Smoker    Packs/day: 1.50    Years: 35.00    Pack years: 52.50    Last attempt to quit: 04/16/2007    Years since quitting: 11.0  . Smokeless tobacco: Never Used  Substance and Sexual Activity  . Alcohol use: Not Currently    Alcohol/week: 0.0 standard drinks    Comment: rare  . Drug use: No  . Sexual activity: Yes    Partners: Male  Lifestyle  . Physical activity:    Days per week: 0 days    Minutes per session: 0 min  . Stress: Not at all  Relationships  . Social connections:    Talks on phone: Never    Gets together: Once a week    Attends religious service: Never    Active member of club or organization: No    Attends meetings of clubs or organizations: Never    Relationship status: Married  Other Topics Concern  . Not on file  Social History Narrative   Pt would like to join silver sneaker program and use the pool.    Tobacco Counseling Counseling given: Not Answered   Clinical Intake:  Pre-visit preparation completed: Yes  Pain : 0-10 Pain Score: 2  Pain Type: Chronic pain Pain Location: Shoulder Pain Orientation: Right Pain Radiating Towards: radiates into rib area and down arm into hand Pain Descriptors / Indicators: Stabbing, Aching, Throbbing, Tingling Pain Onset: More than a month ago Pain Frequency: Intermittent Pain Relieving Factors: moist heat helps the pain and Tylenol.  Pain Relieving Factors: moist heat helps the pain and Tylenol.  Nutritional Risks: None Diabetes: No  How often do you need to have someone help you when you read instructions, pamphlets, or other written materials from your doctor or pharmacy?: 1 - Never What is the last grade level you completed in school?:  12  Interpreter Needed?: No  Information entered by :: Orlie Dakin, LPN   Activities of Daily Living In your present state of health, do you have any difficulty performing the following activities: 04/24/2018 03/15/2018  Hearing? N N  Vision? N N  Difficulty concentrating or making decisions? N N  Walking or climbing stairs? N N  Dressing or bathing? N N  Doing errands, shopping? N N  Preparing Food and eating ?  N -  Using the Toilet? N -  In the past six months, have you accidently leaked urine? Y -  Comment wears a panti liner -  Do you have problems with loss of bowel control? N -  Managing your Medications? N -  Managing your Finances? N -  Housekeeping or managing your Housekeeping? N -  Some recent data might be hidden     Immunizations and Health Maintenance Immunization History  Administered Date(s) Administered  . Tdap 12/30/2015   There are no preventive care reminders to display for this patient.  Patient Care Team: Gregor Hams, MD as PCP - General (Family Medicine)  Indicate any recent Medical Services you may have received from other than [facility] providers in the past year (date may be approximate).     Assessment:   This is a routine wellness examination for Jeany.Physical assessment deferred to PCP.   Hearing/Vision screen  Visual Acuity Screening   Right eye Left eye Both eyes  Without correction: 20/20 20/20 20/20   With correction:     Comments: Wears reading glasses when reading  Hearing Screening Comments: Whisper test done and pt heard all 3 words and repeated back.  Dietary issues and exercise activities discussed: Current Exercise Habits: The patient does not participate in regular exercise at present, Exercise limited by: neurologic condition(s) Diet Eats a healthy diet of vegetables, fruits, grains. Drinks almond milk daily. Breakfast: grapenuts with almond milk. Lunch: ham sandwich with lettuce, spinach and cheese. Dinner:  Meat and  vegetables, potato     Goals    . Exercise 3x per week (30 min per time)     [facility] silver sneakers program [facility] the [facility]      Depression Screen PHQ 2/9 Scores 04/24/2018 03/15/2018 09/13/2017 05/31/2016  PHQ - 2 Score 0 0 1 1  PHQ- 9 Score 0 0 4 3    Fall Risk Fall Risk  04/24/2018  Falls in the past year? 0    Is the patient's home free of loose throw rugs in walkways, pet beds, electrical cords, etc?   yes      Grab bars in the bathroom? no      Handrails on the stairs?   no      Adequate lighting?   yes    Cognitive Function:     6CIT Screen 04/24/2018  What Year? 0 points  What month? 0 points  What time? 0 points  Count back from 20 0 points  Months in reverse 0 points  Repeat phrase 2 points  Total Score 2    Screening Tests Health Maintenance  Topic Date Due  . INFLUENZA VACCINE  12/14/2018 (Originally 12/22/2017)  . MAMMOGRAM  12/14/2018  . COLONOSCOPY  02/26/2021  . TETANUS/TDAP  12/29/2025  . Hepatitis C Screening  Completed  . HIV Screening  Completed       Plan:      Ms. Stallman , Thank you for taking time to come for your Medicare Wellness Visit. I appreciate your ongoing commitment to your health goals. Please review the following plan we discussed and let me know if I can assist you in the future.   Please schedule your next medicare wellness visit with me in 1 yr.   These are the goals we discussed: Goals    . Exercise 3x per week (30 min per time)     [facility] silver sneakers program at the [facility]       This is a list  of the screening recommended for you and due dates:  Health Maintenance  Topic Date Due  . Flu Shot  12/14/2018*  . Mammogram  12/14/2018  . Colon Cancer Screening  02/26/2021  . Tetanus Vaccine  12/29/2025  .  Hepatitis C: One time screening is recommended by Center for Disease Control  (CDC) for  adults born from [birth year] through 1965.   Completed  . HIV Screening  Completed  *Topic was postponed. The date shown is not the  original due date.     I have personally reviewed and noted the following in the patient's chart:   . Medical and social history . Use of alcohol, tobacco or illicit drugs  . Current medications and supplements . Functional ability and status . Nutritional status . Physical activity . Advanced directives . List of other physicians . Hospitalizations, surgeries, and ER visits in previous 12 months . Vitals . Screenings to include cognitive, depression, and falls . Referrals and appointments  In addition, I have reviewed and discussed with patient certain preventive protocols, quality metrics, and best practice recommendations. A written personalized care plan for preventive services as well as general preventive health recommendations were provided to patient.     Joanne Chars, LPN   [id number]

## 2018-04-24 NOTE — Patient Instructions (Signed)
Ms. Yvonne Gonzales , Thank you for taking time to come for your Medicare Wellness Visit. I appreciate your ongoing commitment to your health goals. Please review the following plan we discussed and let me know if I can assist you in the future.   Please schedule your next medicare wellness visit with me in 1 yr.  These are the goals we discussed: Goals    . Exercise 3x per week (30 min per time)     [facility] silver sneakers program at the [facility] Adopting a healthy lifestyle and getting preventive care can go a long way to promote health and wellness. Talk with your health care provider about what schedule of regular examinations is right for you. This is a good chance for you to check in with your provider about disease prevention and staying healthy. In between checkups, there are plenty of things you can do on your own. Experts have done a lot of research about which lifestyle changes and preventive measures are most likely to keep you healthy. Ask your health care provider for more information. Weight and diet Eat a healthy diet  Be sure to include plenty of vegetables, fruits, low-fat dairy products, and lean protein.  Do not eat a lot of foods high in solid fats, added sugars, or salt.  Get regular exercise. This is one of the most important things you can do for your health. ? Most adults should exercise for at least 150 minutes each week. The exercise should increase your heart rate and make you sweat (moderate-intensity exercise). ? Most adults should also do strengthening exercises at least twice a week. This is in addition to the moderate-intensity exercise.  Maintain a healthy weight  Body mass index (BMI) is a measurement that can be used to identify possible weight problems. It estimates body fat based on height and weight. Your health care provider can help determine your BMI and help you achieve or maintain a healthy weight.  For females 57 years of age  and older: ? A BMI below 18.5 is considered underweight. ? A BMI of 18.5 to 24.9 is normal. ? A BMI of 25 to 29.9 is considered overweight. ? A BMI of 30 and above is considered obese.  Watch levels of cholesterol and blood lipids  You should start having your blood tested for lipids and cholesterol at 57 years of age, then have this test every 5 years.  You may need to have your cholesterol levels checked more often if: ? Your lipid or cholesterol levels are high. ? You are older than 57 years of age. ? You are at high risk for heart disease.  Cancer screening Lung Cancer  Lung cancer screening is recommended for adults 52-32 years old who are at high risk for lung cancer because of a history of smoking.  A yearly low-dose CT scan of the lungs is recommended for people who: ? Currently smoke. ? Have quit within the past 15 years. ? Have at least a 30-pack-year history of smoking. A pack year is smoking an average of one pack of cigarettes a day for 1 year.  Yearly screening should continue until it has been 15 years since you quit.  Yearly screening should stop if you develop a health problem that would prevent you from having lung cancer treatment.  Breast Cancer  Practice breast self-awareness. This means understanding how your breasts normally appear and feel.  It also means doing regular breast self-exams. Let  your health care provider know about any changes, no matter how small.  If you are in your 20s or 30s, you should have a clinical breast exam (CBE) by a health care provider every 1-3 years as part of a regular health exam.  If you are 15 or older, have a CBE every year. Also consider having a breast X-ray (mammogram) every year.  If you have a family history of breast cancer, talk to your health care provider about genetic screening.  If you are at high risk for breast cancer, talk to your health care provider about having an MRI and a mammogram every  year.  Breast cancer gene (BRCA) assessment is recommended for women who have family members with BRCA-related cancers. BRCA-related cancers include: ? Breast. ? Ovarian. ? Tubal. ? Peritoneal cancers.  Results of the assessment will determine the need for genetic counseling and BRCA1 and BRCA2 testing.  Cervical Cancer Your health care provider may recommend that you be screened regularly for cancer of the pelvic organs (ovaries, uterus, and vagina). This screening involves a pelvic examination, including checking for microscopic changes to the surface of your cervix (Pap test). You may be encouraged to have this screening done every 3 years, beginning at age 61.  For women ages 58-65, health care providers may recommend pelvic exams and Pap testing every 3 years, or they may recommend the Pap and pelvic exam, combined with testing for human papilloma virus (HPV), every 5 years. Some types of HPV increase your risk of cervical cancer. Testing for HPV may also be done on women of any age with unclear Pap test results.  Other health care providers may not recommend any screening for nonpregnant women who are considered low risk for pelvic cancer and who do not have symptoms. Ask your health care provider if a screening pelvic exam is right for you.  If you have had past treatment for cervical cancer or a condition that could lead to cancer, you need Pap tests and screening for cancer for at least 20 years after your treatment. If Pap tests have been discontinued, your risk factors (such as having a new sexual partner) need to be reassessed to determine if screening should resume. Some women have medical problems that increase the chance of getting cervical cancer. In these cases, your health care provider may recommend more frequent screening and Pap tests.  Colorectal Cancer  This type of cancer can be detected and often prevented.  Routine colorectal cancer screening usually begins at 57  years of age and continues through 56 years of age.  Your health care provider may recommend screening at an earlier age if you have risk factors for colon cancer.  Your health care provider may also recommend using home test kits to check for hidden blood in the stool.  A small camera at the end of a tube can be used to examine your colon directly (sigmoidoscopy or colonoscopy). This is done to check for the earliest forms of colorectal cancer.  Routine screening usually begins at age 36.  Direct examination of the colon should be repeated every 5-10 years through 57 years of age. However, you may need to be screened more often if early forms of precancerous polyps or small growths are found.  Skin Cancer  Check your skin from head to toe regularly.  Tell your health care provider about any new moles or changes in moles, especially if there is a change in a mole's shape or color.  Also tell your health care provider if you have a mole that is larger than the size of a pencil eraser.  Always use sunscreen. Apply sunscreen liberally and repeatedly throughout the day.  Protect yourself by wearing long sleeves, pants, a wide-brimmed hat, and sunglasses whenever you are outside.  Heart disease, diabetes, and high blood pressure  High blood pressure causes heart disease and increases the risk of stroke. High blood pressure is more likely to develop in: ? People who have blood pressure in the high end of the normal range (130-139/85-89 mm Hg). ? People who are overweight or obese. ? People who are African American.  If you are 48-22 years of age, have your blood pressure checked every 3-5 years. If you are 81 years of age or older, have your blood pressure checked every year. You should have your blood pressure measured twice-once when you are at a hospital or clinic, and once when you are not at a hospital or clinic. Record the average of the two measurements. To check your blood pressure  when you are not at a hospital or clinic, you can use: ? An automated blood pressure machine at a pharmacy. ? A home blood pressure monitor.  If you are between 11 years and 24 years old, ask your health care provider if you should take aspirin to prevent strokes.  Have regular diabetes screenings. This involves taking a blood sample to check your fasting blood sugar level. ? If you are at a normal weight and have a low risk for diabetes, have this test once every three years after 57 years of age. ? If you are overweight and have a high risk for diabetes, consider being tested at a younger age or more often. Preventing infection Hepatitis B  If you have a higher risk for hepatitis B, you should be screened for this virus. You are considered at high risk for hepatitis B if: ? You were born in a country where hepatitis B is common. Ask your health care provider which countries are considered high risk. ? Your parents were born in a high-risk country, and you have not been immunized against hepatitis B (hepatitis B vaccine). ? You have HIV or AIDS. ? You use needles to inject street drugs. ? You live with someone who has hepatitis B. ? You have had sex with someone who has hepatitis B. ? You get hemodialysis treatment. ? You take certain medicines for conditions, including cancer, organ transplantation, and autoimmune conditions.  Hepatitis C  Blood testing is recommended for: ? Everyone born from [birth year] through 1965. ? Anyone with known risk factors for hepatitis C.  Sexually transmitted infections (STIs)  You should be screened for sexually transmitted infections (STIs) including gonorrhea and chlamydia if: ? You are sexually active and are younger than 57 years of age. ? You are older than 57 years of age and your health care provider tells you that you are at risk for this type of infection. ? Your sexual activity has changed since you were last screened and you are at an increased  risk for chlamydia or gonorrhea. Ask your health care provider if you are at risk.  If you do not have HIV, but are at risk, it may be recommended that you take a prescription medicine daily to prevent HIV infection. This is called pre-exposure prophylaxis (PrEP). You are considered at risk if: ? You are sexually active and do not regularly use condoms or know the HIV status of  your partner(s). ? You take drugs by injection. ? You are sexually active with a partner who has HIV.  Talk with your health care provider about whether you are at high risk of being infected with HIV. If you choose to begin PrEP, you should first be tested for HIV. You should then be tested every 3 months for as long as you are taking PrEP. Pregnancy  If you are premenopausal and you may become pregnant, ask your health care provider about preconception counseling.  If you may become pregnant, take 400 to 800 micrograms (mcg) of folic acid every day.  If you want to prevent pregnancy, talk to your health care provider about birth control (contraception). Osteoporosis and menopause  Osteoporosis is a disease in which the bones lose minerals and strength with aging. This can result in serious bone fractures. Your risk for osteoporosis can be identified using a bone density scan.  If you are 45 years of age or older, or if you are at risk for osteoporosis and fractures, ask your health care provider if you should be screened.  Ask your health care provider whether you should take a calcium or vitamin D supplement to lower your risk for osteoporosis.  Menopause may have certain physical symptoms and risks.  Hormone replacement therapy may reduce some of these symptoms and risks. Talk to your health care provider about whether hormone replacement therapy is right for you. Follow these instructions at home:  Schedule regular health, dental, and eye exams.  Stay current with your immunizations.  Do not use any  tobacco products including cigarettes, chewing tobacco, or electronic cigarettes.  If you are pregnant, do not drink alcohol.  If you are breastfeeding, limit how much and how often you drink alcohol.  Limit alcohol intake to no more than 1 drink per day for nonpregnant women. One drink equals 12 ounces of beer, 5 ounces of wine, or 1 ounces of hard liquor.  Do not use street drugs.  Do not share needles.  Ask your health care provider for help if you need support or information about quitting drugs.  Tell your health care provider if you often feel depressed.  Tell your health care provider if you have ever been abused or do not feel safe at home. This information is not intended to replace advice given to you by your health care provider. Make sure you discuss any questions you have with your health care provider. Document Released: 11/23/2010 Document Revised: 10/16/2015 Document Reviewed: 02/11/2015 Elsevier Interactive Patient Education  [employer].

## 2018-04-29 ENCOUNTER — Encounter: Payer: Self-pay | Admitting: Family Medicine

## 2018-05-01 MED ORDER — DONEPEZIL HCL 10 MG PO TABS: 10.0000 mg | ORAL_TABLET | Freq: Every day | ORAL | 1 refills | Status: DC

## 2018-05-31 DIAGNOSIS — Z79899 Other long term (current) drug therapy: Secondary | ICD-10-CM | POA: Diagnosis not present

## 2018-05-31 DIAGNOSIS — G893 Neoplasm related pain (acute) (chronic): Secondary | ICD-10-CM | POA: Diagnosis not present

## 2018-05-31 DIAGNOSIS — R0789 Other chest pain: Secondary | ICD-10-CM | POA: Diagnosis not present

## 2018-05-31 DIAGNOSIS — M549 Dorsalgia, unspecified: Secondary | ICD-10-CM | POA: Diagnosis not present

## 2018-05-31 DIAGNOSIS — R69 Illness, unspecified: Secondary | ICD-10-CM | POA: Diagnosis not present

## 2018-05-31 DIAGNOSIS — Z5181 Encounter for therapeutic drug level monitoring: Secondary | ICD-10-CM | POA: Diagnosis not present

## 2018-09-14 ENCOUNTER — Ambulatory Visit: Payer: Medicare HMO | Admitting: Family Medicine

## 2018-10-22 ENCOUNTER — Encounter: Payer: Self-pay | Admitting: Family Medicine

## 2018-10-24 MED ORDER — ATORVASTATIN CALCIUM 40 MG PO TABS: 40.0000 mg | ORAL_TABLET | Freq: Every day | ORAL | 3 refills | Status: DC

## 2018-10-26 ENCOUNTER — Telehealth: Payer: Self-pay | Admitting: Family Medicine

## 2018-10-26 MED ORDER — ATORVASTATIN CALCIUM 40 MG PO TABS: 40.0000 mg | ORAL_TABLET | Freq: Every day | ORAL | 3 refills | Status: AC

## 2018-10-26 NOTE — Telephone Encounter (Signed)
Lipitor refilled

## 2018-11-05 ENCOUNTER — Other Ambulatory Visit: Payer: Self-pay | Admitting: Family Medicine

## 2018-11-20 ENCOUNTER — Encounter: Payer: Self-pay | Admitting: Family Medicine

## 2018-11-21 ENCOUNTER — Encounter: Payer: Self-pay | Admitting: Family Medicine

## 2018-11-21 ENCOUNTER — Telehealth (INDEPENDENT_AMBULATORY_CARE_PROVIDER_SITE_OTHER): Payer: Medicare HMO | Admitting: Family Medicine

## 2018-11-21 VITALS — Ht 66.0 in | Wt 185.0 lb

## 2018-11-21 DIAGNOSIS — Z789 Other specified health status: Secondary | ICD-10-CM | POA: Diagnosis not present

## 2018-11-21 NOTE — Progress Notes (Signed)
Virtual Visit  via Video Note  I connected with      Yvonne Gonzales by a video enabled telemedicine application and verified that I am speaking with the correct person using two identifiers.   I discussed the limitations of evaluation and management by telemedicine and the availability of in person appointments. The patient expressed understanding and agreed to proceed.  History of Present Illness: Yvonne Gonzales is a 58 y.o. female who would like to discuss breathing.  Darcee has some breathing difficulties relative her lung surgery for lung cancer.  She finds wearing a mask very difficult and challenging.  She does have asked that she can wear for short period of time she needs to.  Like a letter to allow her to not wear mask with activity. No fevers chills nausea vomiting or diarrhea.  She relates exposure to group activity to reduce COVID-19 exposure.   Observations/Objective: Ht 5\' 6"  (1.676 m)   Wt 185 lb (83.9 kg)   BMI 29.86 kg/m  Wt Readings from Last 5 Encounters:  11/21/18 185 lb (83.9 kg)  04/24/18 181 lb (82.1 kg)  03/15/18 176 lb (79.8 kg)  09/22/17 165 lb (74.8 kg)  09/15/17 166 lb (75.3 kg)   Exam: Appearance nontoxic appearing Normal Speech.  No shortness of breath.  Lab and Radiology Results No results found for this or any previous visit (from the past 72 hour(s)). No results found.   Assessment and Plan: 58 y.o. female with breathing difficulty with mass.  Letter written however encourage patient to use the mask as much as possible even if before short peers of time while near groups of people at the grocery store.  Recheck as needed.  PDMP not reviewed this encounter. No orders of the defined types were placed in this encounter.  No orders of the defined types were placed in this encounter.   Follow Up Instructions:    I discussed the assessment and treatment plan with the patient. The patient was provided an opportunity to ask questions and all were  answered. The patient agreed with the plan and demonstrated an understanding of the instructions.   The patient was advised to call back or seek an in-person evaluation if the symptoms worsen or if the condition fails to improve as anticipated.  Time: 15 minutes of intraservice time, with >22 minutes of total time during today's visit.      Historical information moved to improve visibility of documentation.  Past Medical History:  Diagnosis Date  . Acute pancreatitis   . Arthritis   . Cancer ([place]) Lung (right side)  . Chronic shoulder pain 07/25/2013   [facility] since cancer treatment.   . Colon polyps   . Complication of anesthesia    prolonged sedation  . Depression 07/25/2013  . Dyspnea    with exertion  . Generalized anxiety disorder 07/25/2013  . GERD (gastroesophageal reflux disease)   . Hyperlipidemia 07/25/2013  . Memory loss   . Mild cognitive impairment 07/25/2013   [facility] - thought to be secondary to cancer treatment   . Narcolepsy   . Nerve pain    right shoulder, side  . Pancreatic rests in stomach   . Toxic encephalopathy    Past Surgical History:  Procedure Laterality Date  . CHOLECYSTECTOMY    . COLONOSCOPY    . HYSTEROSCOPY W/D&C N/A 03/24/2016   Procedure: DILATATION AND CURETTAGE /HYSTEROSCOPY;  Surgeon: Emily Filbert, MD;  Location: [facility];  Service: Gynecology;  Laterality: N/A;  . LUNG CANCER SURGERY Right    pancost tumor  . LUNG REMOVAL, PARTIAL Right   . lymphectomy    . ribs removed, right side     chest wall removal  . TONSILLECTOMY    . TUBAL LIGATION    . UPPER ESOPHAGEAL ENDOSCOPIC ULTRASOUND (EUS)     Social History   Tobacco Use  . Smoking status: Former Smoker    Packs/day: 1.50    Years: 35.00    Pack years: 52.50    Quit date: 04/16/2007    Years since quitting: 11.6  . Smokeless tobacco: Never Used  Substance Use Topics  . Alcohol use: Not Currently    Alcohol/week: 0.0 standard drinks     Comment: rare   family history includes Breast cancer (age of onset: 71) in her mother; Cancer in her father and mother.  Medications: Current Outpatient Medications  Medication Sig Dispense Refill  . atorvastatin (LIPITOR) 40 MG tablet Take 1 tablet (40 mg total) by mouth daily. 90 tablet 3  . donepezil (ARICEPT) 10 MG tablet TAKE 1 TABLET BY MOUTH EVERYDAY AT BEDTIME 90 tablet 0  . Krill Oil 1000 MG CAPS Take 1 capsule by mouth daily.    . Multiple Vitamin (MULTIVITAMIN) tablet Take 1 tablet by mouth daily.    . NUCYNTA 50 MG tablet Take 1 tablet by mouth 2 (two) times daily as needed.    Marland Kitchen tiZANidine (ZANAFLEX) 4 MG tablet Take 1 tablet by mouth 3 (three) times daily as needed. Moslty at night  2  . triamcinolone (KENALOG) 0.1 % paste Apply to corners of mouth BID as needed. 5 g 12  . vitamin C (ASCORBIC ACID) 500 MG tablet Take 1,000 mg by mouth daily.    Marland Kitchen amitriptyline (ELAVIL) 25 MG tablet Take 1 tablet (25 mg total) by mouth at bedtime. 90 tablet 3   No current facility-administered medications for this visit.    Allergies  Allergen Reactions  . Latex Hives    Red rash  . Amphetamine-Dextroamphetamine     HA  . Diclofenac Sodium     Voltaren  . Tape   . Tolmetin Nausea And Vomiting    NSAID High doses only NSAID NSAID

## 2018-11-21 NOTE — Progress Notes (Signed)
Does not want to wear mask, had lung cancer and lung removed, too difficult to breathe with mask.

## 2018-11-29 DIAGNOSIS — G894 Chronic pain syndrome: Secondary | ICD-10-CM | POA: Diagnosis not present

## 2018-11-29 DIAGNOSIS — R69 Illness, unspecified: Secondary | ICD-10-CM | POA: Diagnosis not present

## 2018-11-29 DIAGNOSIS — G893 Neoplasm related pain (acute) (chronic): Secondary | ICD-10-CM | POA: Diagnosis not present

## 2018-11-29 DIAGNOSIS — Z5181 Encounter for therapeutic drug level monitoring: Secondary | ICD-10-CM | POA: Diagnosis not present

## 2018-11-29 DIAGNOSIS — R0789 Other chest pain: Secondary | ICD-10-CM | POA: Diagnosis not present

## 2018-11-29 DIAGNOSIS — Z79899 Other long term (current) drug therapy: Secondary | ICD-10-CM | POA: Diagnosis not present

## 2019-01-30 ENCOUNTER — Other Ambulatory Visit: Payer: Self-pay | Admitting: Family Medicine

## 2019-03-14 DIAGNOSIS — R69 Illness, unspecified: Secondary | ICD-10-CM | POA: Diagnosis not present

## 2019-03-14 DIAGNOSIS — G47419 Narcolepsy without cataplexy: Secondary | ICD-10-CM | POA: Diagnosis not present

## 2019-03-21 DIAGNOSIS — R69 Illness, unspecified: Secondary | ICD-10-CM | POA: Diagnosis not present

## 2019-04-13 ENCOUNTER — Other Ambulatory Visit: Payer: Self-pay

## 2019-04-13 ENCOUNTER — Ambulatory Visit (INDEPENDENT_AMBULATORY_CARE_PROVIDER_SITE_OTHER): Payer: Medicare HMO | Admitting: Physician Assistant

## 2019-04-13 ENCOUNTER — Encounter: Payer: Self-pay | Admitting: Physician Assistant

## 2019-04-13 VITALS — BP 148/92 | HR 108 | Temp 98.1°F | Ht 63.0 in | Wt 201.0 lb

## 2019-04-13 DIAGNOSIS — R238 Other skin changes: Secondary | ICD-10-CM | POA: Diagnosis not present

## 2019-04-13 DIAGNOSIS — R21 Rash and other nonspecific skin eruption: Secondary | ICD-10-CM

## 2019-04-13 DIAGNOSIS — L0100 Impetigo, unspecified: Secondary | ICD-10-CM

## 2019-04-13 MED ORDER — VALACYCLOVIR HCL 1 G PO TABS: 1000.0000 mg | ORAL_TABLET | Freq: Three times a day (TID) | ORAL | 0 refills | Status: AC

## 2019-04-13 MED ORDER — CEPHALEXIN 500 MG PO CAPS: 500.0000 mg | ORAL_CAPSULE | Freq: Two times a day (BID) | ORAL | 0 refills | Status: AC

## 2019-04-13 MED ORDER — KETOROLAC TROMETHAMINE 60 MG/2ML IM SOLN
60.0000 mg | Freq: Once | INTRAMUSCULAR | Status: AC
  Administered 2019-04-13: 60 mg via INTRAMUSCULAR

## 2019-04-13 NOTE — Patient Instructions (Signed)
Shingles  Shingles is an infection. It gives you a painful skin rash and blisters that have fluid in them. Shingles is caused by the same germ (virus) that causes chickenpox. Shingles only happens in people who:  Have had chickenpox.  Have been given a shot of medicine (vaccine) to protect against chickenpox. Shingles is rare in this group. The first symptoms of shingles may be itching, tingling, or pain in an area on your skin. A rash will show on your skin a few days or weeks later. The rash is likely to be on one side of your body. The rash usually has a shape like a belt or a band. Over time, the rash turns into fluid-filled blisters. The blisters will break open, change into scabs, and dry up. Medicines may:  Help with pain and itching.  Help you get better sooner.  Help to prevent long-term problems. Follow these instructions at home: Medicines  Take over-the-counter and prescription medicines only as told by your doctor.  Put on an anti-itch cream or numbing cream where you have a rash, blisters, or scabs. Do this as told by your doctor. Helping with itching and discomfort   Put cold, wet cloths (cold compresses) on the area of the rash or blisters as told by your doctor.  Cool baths can help you feel better. Try adding baking soda or dry oatmeal to the water to lessen itching. Do not bathe in hot water. Blister and rash care  Keep your rash covered with a loose bandage (dressing).  Wear loose clothing that does not rub on your rash.  Keep your rash and blisters clean. To do this, wash the area with mild soap and cool water as told by your doctor.  Check your rash every day for signs of infection. Check for: ? More redness, swelling, or pain. ? Fluid or blood. ? Warmth. ? Pus or a bad smell.  Do not scratch your rash. Do not pick at your blisters. To help you to not scratch: ? Keep your fingernails clean and cut short. ? Wear gloves or mittens when you sleep, if  scratching is a problem. General instructions  Rest as told by your doctor.  Keep all follow-up visits as told by your doctor. This is important.  Wash your hands often with soap and water. If soap and water are not available, use hand sanitizer. Doing this lowers your chance of getting a skin infection caused by germs (bacteria).  Your infection can cause chickenpox in people who have never had chickenpox or never got a shot of chickenpox vaccine. If you have blisters that did not change into scabs yet, try not to touch other people or be around other people, especially: ? Babies. ? Pregnant women. ? Children who have areas of red, itchy, or rough skin (eczema). ? Very old people who have transplants. ? People who have a long-term (chronic) sickness, like cancer or AIDS. Contact a doctor if:  Your pain does not get better with medicine.  Your pain does not get better after the rash heals.  You have any signs of infection in the rash area. These signs include: ? More redness, swelling, or pain around the rash. ? Fluid or blood coming from the rash. ? The rash area feeling warm to the touch. ? Pus or a bad smell coming from the rash. Get help right away if:  The rash is on your face or nose.  You have pain in your face or pain by   your eye.  You lose feeling on one side of your face.  You have trouble seeing.  You have ear pain, or you have ringing in your ear.  You have a loss of taste.  Your condition gets worse. Summary  Shingles gives you a painful skin rash and blisters that have fluid in them.  Shingles is an infection. It is caused by the same germ (virus) that causes chickenpox.  Keep your rash covered with a loose bandage (dressing). Wear loose clothing that does not rub on your rash.  If you have blisters that did not change into scabs yet, try not to touch other people or be around people. This information is not intended to replace advice given to you by  your health care provider. Make sure you discuss any questions you have with your health care provider. Document Released: 10/27/2007 Document Revised: 09/01/2018 Document Reviewed: 01/12/2017 Elsevier Patient Education  2020 [employer]  

## 2019-04-13 NOTE — Progress Notes (Signed)
Please print any prescriptions for this patient. Thanks.

## 2019-04-13 NOTE — Progress Notes (Signed)
Subjective:    Patient ID: Yvonne Gonzales, female    DOB: 06-19-1960, 58 y.o.   MRN: [id number]  HPI  Patient is a 58 year old female who presents to the clinic with a painful, itchy, red rash of the right side of her face.  Rash started on Tuesday and has continued to develop.  She does have a history of fever blisters but this feels much more severe.  She noticed a tiny sore in her right nare a few days before the rash on her face.  She denies any fever, chills, cough, loss of smell or taste, shortness of breath, GI symptoms.  She put some over-the-counter topical antibiotic ointment that has not helped.  She has since noticed some painful vesicles on her upper lip.  .. Active Ambulatory Problems    Diagnosis Date Noted  . Hyperlipidemia 07/25/2013  . GERD (gastroesophageal reflux disease) 07/25/2013  . Chronic shoulder pain 07/25/2013  . Generalized anxiety disorder 07/25/2013  . Essential hypertension, benign 07/25/2013  . Toxic encephalopathy 10/05/2013  . Narcolepsy 04/03/2014  . Hypertriglyceridemia 04/17/2014  . Insomnia 11/07/2015  . Breast mass, right 11/19/2015  . Plantar fibromatosis 12/19/2015  . Osteopenia 05/31/2016  . Post-menopausal 05/31/2016  . History of lung cancer 11/02/2016  . Paresthesia 04/06/2017  . Elevated fasting glucose 09/16/2017   Resolved Ambulatory Problems    Diagnosis Date Noted  . Mild cognitive impairment 07/25/2013  . Depression 07/25/2013  . Malignant neoplasm of upper lobe, unspecified bronchus or lung ([facility]) 02/12/2013  . Chest pain 11/02/2016   Past Medical History:  Diagnosis Date  . Acute pancreatitis   . Arthritis   . Cancer ([facility]) Lung (right side)  . Colon polyps   . Complication of anesthesia   . Dyspnea   . Memory loss   . Nerve pain   . Pancreatic rests in stomach       Review of Systems See HPI.     Objective:   Physical Exam Vitals signs reviewed.  HENT:     Head: Normocephalic.     Ears:     Comments: Firm  pea-sized post auricular node, right side.  Cardiovascular:     Rate and Rhythm: Regular rhythm. Tachycardia present.     Pulses: Normal pulses.  Pulmonary:     Effort: Pulmonary effort is normal.  Skin:    Comments: 2 circular areas of erythema with central dried cluster with honey crusted appearance at the center total size 3 cm by 2cm and 2cm by 1cm.   1cm by 1cm area of erythema at the corner of right eye.   Crusted vesicle of upper lip.     Neurological:     General: No focal deficit present.     Mental Status: She is alert and oriented to person, place, and time.  Psychiatric:        Mood and Affect: Mood normal.           Assessment & Plan:  Marland KitchenMarland KitchenAiyanah was seen today for rash.  Diagnoses and all orders for this visit:  Rash -     valACYclovir (VALTREX) 1000 MG tablet; Take 1 tablet (1,000 mg total) by mouth 3 (three) times daily. For 7 days. -     cephALEXin (KEFLEX) 500 MG capsule; Take 1 capsule (500 mg total) by mouth 2 (two) times daily. For 7 days. -     ketorolac (TORADOL) injection 60 mg -     SureSwab HSV, Type 1/2 DNA, PCR -  Wound culture  Impetigo -     ketorolac (TORADOL) injection 60 mg -     SureSwab HSV, Type 1/2 DNA, PCR -     Wound culture  Vesicle of skin -     valACYclovir (VALTREX) 1000 MG tablet; Take 1 tablet (1,000 mg total) by mouth 3 (three) times daily. For 7 days. -     cephALEXin (KEFLEX) 500 MG capsule; Take 1 capsule (500 mg total) by mouth 2 (two) times daily. For 7 days.   Exact etiology is unclear.  I suspect this could be some type of herpes variant either herpes simplex or zoster complicated by secondary staph infection.  Patient was treated with Valtrex 3 times a day for 7 days and Keflex twice a day for 7 days.  Patient encouraged to use cool compresses.  Shot of Toradol was given in the office for discomfort.  Patient can certainly continue over the next few days to use over-the-counter Tylenol/ibuprofen for pain relief.   Follow-up as needed or if symptoms changing or worsening.  I would like to make sure the resolution of her postauricular lymph node.  I did obtain viral and wound culture.

## 2019-04-16 NOTE — Progress Notes (Signed)
No abnormal bacterial found only normal skin flora. Still awaiting viral culture. Hopefully your symptoms have began to improve.

## 2019-04-18 LAB — WOUND CULTURE
MICRO NUMBER:: 1125165
SPECIMEN QUALITY:: ADEQUATE

## 2019-04-18 LAB — TEST AUTHORIZATION

## 2019-04-18 LAB — HERPES SIMPLEX/VARICELLA ZOSTER VIRUS RAPID CULTURE

## 2019-04-18 LAB — SURESWAB HSV, TYPE 1/2 DNA, PCR
HSV 1 DNA: NOT DETECTED
HSV 2 DNA: NOT DETECTED

## 2019-04-18 NOTE — Progress Notes (Signed)
Yvonne Gonzales, Herpes simplex was negative. No alarming bacteria on face.   Yvonne Gonzales, I wanted herpes zoster tested as well. It did not result? Can we add?

## 2019-04-24 NOTE — Progress Notes (Deleted)
Subjective:   Yvonne Gonzales is a 58 y.o. female who presents for Medicare Annual (Subsequent) preventive examination.  Review of Systems:  No ROS.  Medicare Wellness Virtual Visit.  Visual/audio telehealth visit, UTA vital signs.   See social history for additional risk factors.      Sleep patterns:    Home Safety/Smoke Alarms: Feels safe in home. Smoke alarms in place.  Living environment;  Seat Belt Safety/Bike Helmet: Wears seat belt.   Female:   Pap-       Mammo-       Dexa scan- not eligible due to age      36- UTD     Objective:     Vitals: There were no vitals taken for this visit.  There is no height or weight on file to calculate BMI.  Advanced Directives 04/24/2018 03/15/2016 10/05/2013  Does Patient Have a Medical Advance Directive? No No Patient does not have advance directive;Patient would not like information  Would patient like information on creating a medical advance directive? No - Patient declined Yes - Scientist, clinical (histocompatibility and immunogenetics) given -    Tobacco Social History   Tobacco Use  Smoking Status Former Smoker  . Packs/day: 1.50  . Years: 35.00  . Pack years: 52.50  . Quit date: 04/16/2007  . Years since quitting: 12.0  Smokeless Tobacco Never Used     Counseling given: Not Answered   Clinical Intake:                       Past Medical History:  Diagnosis Date  . Acute pancreatitis   . Arthritis   . Cancer ([facility]) Lung (right side)  . Chronic shoulder pain 07/25/2013   [facility] since cancer treatment.   . Colon polyps   . Complication of anesthesia    prolonged sedation  . Depression 07/25/2013  . Dyspnea    with exertion  . Generalized anxiety disorder 07/25/2013  . GERD (gastroesophageal reflux disease)   . Hyperlipidemia 07/25/2013  . Memory loss   . Mild cognitive impairment 07/25/2013   [facility] - thought to be secondary to cancer treatment   . Narcolepsy   . Nerve pain    right shoulder,  side  . Pancreatic rests in stomach   . Toxic encephalopathy    Past Surgical History:  Procedure Laterality Date  . CHOLECYSTECTOMY    . COLONOSCOPY    . HYSTEROSCOPY W/D&C N/A 03/24/2016   Procedure: DILATATION AND CURETTAGE /HYSTEROSCOPY;  Surgeon: Emily Filbert, MD;  Location: [facility];  Service: Gynecology;  Laterality: N/A;  . LUNG CANCER SURGERY Right    pancost tumor  . LUNG REMOVAL, PARTIAL Right   . lymphectomy    . ribs removed, right side     chest wall removal  . TONSILLECTOMY    . TUBAL LIGATION    . UPPER ESOPHAGEAL ENDOSCOPIC ULTRASOUND (EUS)     Family History  Problem Relation Age of Onset  . Cancer Mother        breast  . Breast cancer Mother 62       Recurrence age 63  . Cancer Father        lung,brain,bone   Social History   Socioeconomic History  . Marital status: Married    Spouse name: [name]  . Number of children: 2  . Years of education: 84  . Highest education level: 12th grade  Occupational History  . Occupation: disability  Comment: inventory control analysts  Social Needs  . Financial resource strain: Not hard at all  . Food insecurity    Worry: Never true    Inability: Never true  . Transportation needs    Medical: No    Non-medical: No  Tobacco Use  . Smoking status: Former Smoker    Packs/day: 1.50    Years: 35.00    Pack years: 52.50    Quit date: 04/16/2007    Years since quitting: 12.0  . Smokeless tobacco: Never Used  Substance and Sexual Activity  . Alcohol use: Not Currently    Alcohol/week: 0.0 standard drinks    Comment: rare  . Drug use: No  . Sexual activity: Yes    Partners: Male  Lifestyle  . Physical activity    Days per week: 0 days    Minutes per session: 0 min  . Stress: Not at all  Relationships  . Social Herbalist on phone: Never    Gets together: Once a week    Attends religious service: Never    Active member of club or organization: No    Attends meetings of clubs or organizations:  Never    Relationship status: Married  Other Topics Concern  . Not on file  Social History Narrative   Pt would like to join silver sneaker program and use the pool.    Outpatient Encounter Medications as of 04/30/2019  Medication Sig  . amitriptyline (ELAVIL) 25 MG tablet Take 1 tablet (25 mg total) by mouth at bedtime.  Marland Kitchen atorvastatin (LIPITOR) 40 MG tablet Take 1 tablet (40 mg total) by mouth daily.  . cephALEXin (KEFLEX) 500 MG capsule Take 1 capsule (500 mg total) by mouth 2 (two) times daily. For 7 days.  Marland Kitchen donepezil (ARICEPT) 10 MG tablet TAKE 1 TABLET BY MOUTH EVERYDAY AT BEDTIME  . Krill Oil 1000 MG CAPS Take 1 capsule by mouth daily.  . Multiple Vitamin (MULTIVITAMIN) tablet Take 1 tablet by mouth daily.  . NUCYNTA 50 MG tablet Take 1 tablet by mouth 2 (two) times daily as needed.  Marland Kitchen tiZANidine (ZANAFLEX) 4 MG tablet Take 1 tablet by mouth 3 (three) times daily as needed. Moslty at night  . triamcinolone (KENALOG) 0.1 % paste Apply to corners of mouth BID as needed.  . valACYclovir (VALTREX) 1000 MG tablet Take 1 tablet (1,000 mg total) by mouth 3 (three) times daily. For 7 days.  . vitamin C (ASCORBIC ACID) 500 MG tablet Take 1,000 mg by mouth daily.   No facility-administered encounter medications on file as of 04/30/2019.     Activities of Daily Living In your present state of health, do you have any difficulty performing the following activities: 04/24/2018  Hearing? N  Vision? N  Difficulty concentrating or making decisions? N  Walking or climbing stairs? N  Dressing or bathing? N  Doing errands, shopping? N  Preparing Food and eating ? N  Using the Toilet? N  In the past six months, have you accidently leaked urine? Y  Comment wears a panti liner  Do you have problems with loss of bowel control? N  Managing your Medications? N  Managing your Finances? N  Housekeeping or managing your Housekeeping? N  Some recent data might be hidden    Patient Care  Team: Gregor Hams, MD as PCP - General (Family Medicine)    Assessment:   This is a routine wellness examination for [name].Physical assessment deferred to PCP.  Exercise Activities and Dietary recommendations   Diet  Breakfast: Lunch:  Dinner:       Goals    . Exercise 3x per week (30 min per time)     [employer] silver sneakers program at the [facility]       Fall Risk Fall Risk  04/24/2018  Falls in the past year? 0   Is the patient's home free of loose throw rugs in walkways, pet beds, electrical cords, etc?   {Blank single:19197::"yes","no"}      Grab bars in the bathroom? {Blank single:19197::"yes","no"}      Handrails on the stairs?   {Blank single:19197::"yes","no"}      Adequate lighting?   {Blank single:19197::"yes","no"}   Depression Screen PHQ 2/9 Scores 04/24/2018 03/15/2018 09/13/2017 05/31/2016  PHQ - 2 Score 0 0 1 1  PHQ- 9 Score 0 0 4 3     Cognitive Function     6CIT Screen 04/24/2018  What Year? 0 points  What month? 0 points  What time? 0 points  Count back from 20 0 points  Months in reverse 0 points  Repeat phrase 2 points  Total Score 2    Immunization History  Administered Date(s) Administered  . Tdap 12/30/2015    Screening Tests Health Maintenance  Topic Date Due  . MAMMOGRAM  12/14/2018  . COLONOSCOPY  02/26/2021  . TETANUS/TDAP  12/29/2025  . Hepatitis C Screening  Completed  . HIV Screening  Completed  . INFLUENZA VACCINE  Discontinued        Plan:   ***   I have personally reviewed and noted the following in the patient's chart:   . Medical and social history . Use of alcohol, tobacco or illicit drugs  . Current medications and supplements . Functional ability and status . Nutritional status . Physical activity . Advanced directives . List of other physicians . Hospitalizations, surgeries, and ER visits in previous 12 months . Vitals . Screenings to include cognitive, depression, and falls . Referrals and  appointments  In addition, I have reviewed and discussed with patient certain preventive protocols, quality metrics, and best practice recommendations. A written personalized care plan for preventive services as well as general preventive health recommendations were provided to patient.     Joanne Chars, LPN  93/11/3426

## 2019-04-30 ENCOUNTER — Ambulatory Visit: Payer: Medicare HMO

## 2019-08-31 ENCOUNTER — Other Ambulatory Visit: Payer: Self-pay | Admitting: Physician Assistant

## 2019-08-31 DIAGNOSIS — N6489 Other specified disorders of breast: Secondary | ICD-10-CM

## 2019-09-12 DIAGNOSIS — R69 Illness, unspecified: Secondary | ICD-10-CM | POA: Diagnosis not present

## 2019-09-18 DIAGNOSIS — R69 Illness, unspecified: Secondary | ICD-10-CM | POA: Diagnosis not present

## 2020-03-12 DIAGNOSIS — G47419 Narcolepsy without cataplexy: Secondary | ICD-10-CM | POA: Diagnosis not present

## 2020-03-17 DIAGNOSIS — R69 Illness, unspecified: Secondary | ICD-10-CM | POA: Diagnosis not present

## 2020-06-23 ENCOUNTER — Telehealth: Payer: Self-pay | Admitting: General Practice

## 2020-07-11 NOTE — Telephone Encounter (Signed)
Documentation only.

## 2020-11-07 DIAGNOSIS — G47419 Narcolepsy without cataplexy: Secondary | ICD-10-CM | POA: Diagnosis not present

## 2020-11-07 DIAGNOSIS — G929 Unspecified toxic encephalopathy: Secondary | ICD-10-CM | POA: Diagnosis not present

## 2021-01-27 ENCOUNTER — Telehealth: Payer: Self-pay | Admitting: Lab

## 2021-01-27 NOTE — Chronic Care Management (AMB) (Signed)
  Chronic Care Management   Note  01/27/2021 Name: Alyze Lauf MRN: [id number] DOB: 02/03/1961  Mayra Jolliffe is a 60 y.o. year old female who is a primary care patient of Gregor Hams, MD. I reached out to Alfonso Ramus by phone today in response to a referral sent by Ms. Julious Oka PCP, Gregor Hams, MD.   Ms. Wauneka was given information about Chronic Care Management services today including:  CCM service includes personalized support from designated clinical staff supervised by her physician, including individualized plan of care and coordination with other care providers 24/7 contact phone numbers for assistance for urgent and routine care needs. Service will only be billed when office clinical staff spend 20 minutes or more in a month to coordinate care. Only one practitioner may furnish and bill the service in a calendar month. The patient may stop CCM services at any time (effective at the end of the month) by phone call to the office staff.   Patient did not agree to enrollment in care management services and does not wish to consider at this time.  Follow up plan:   [facility]

## 2021-08-04 DIAGNOSIS — E559 Vitamin D deficiency, unspecified: Secondary | ICD-10-CM | POA: Diagnosis not present

## 2021-08-04 DIAGNOSIS — E785 Hyperlipidemia, unspecified: Secondary | ICD-10-CM | POA: Diagnosis not present

## 2021-08-04 DIAGNOSIS — Z1231 Encounter for screening mammogram for malignant neoplasm of breast: Secondary | ICD-10-CM | POA: Diagnosis not present

## 2021-08-04 DIAGNOSIS — E538 Deficiency of other specified B group vitamins: Secondary | ICD-10-CM | POA: Diagnosis not present

## 2021-08-04 DIAGNOSIS — Z6833 Body mass index (BMI) 33.0-33.9, adult: Secondary | ICD-10-CM | POA: Diagnosis not present

## 2021-08-04 DIAGNOSIS — R69 Illness, unspecified: Secondary | ICD-10-CM | POA: Diagnosis not present

## 2021-08-04 DIAGNOSIS — Z1211 Encounter for screening for malignant neoplasm of colon: Secondary | ICD-10-CM | POA: Diagnosis not present

## 2021-08-04 DIAGNOSIS — F5104 Psychophysiologic insomnia: Secondary | ICD-10-CM | POA: Diagnosis not present

## 2021-08-04 DIAGNOSIS — E6609 Other obesity due to excess calories: Secondary | ICD-10-CM | POA: Diagnosis not present

## 2021-11-05 DIAGNOSIS — G47419 Narcolepsy without cataplexy: Secondary | ICD-10-CM | POA: Diagnosis not present

## 2021-11-05 DIAGNOSIS — R69 Illness, unspecified: Secondary | ICD-10-CM | POA: Diagnosis not present

## 2021-11-05 DIAGNOSIS — G929 Unspecified toxic encephalopathy: Secondary | ICD-10-CM | POA: Diagnosis not present

## 2022-02-03 DIAGNOSIS — N3001 Acute cystitis with hematuria: Secondary | ICD-10-CM | POA: Diagnosis not present

## 2022-06-24 DIAGNOSIS — R300 Dysuria: Secondary | ICD-10-CM | POA: Diagnosis not present

## 2022-06-24 DIAGNOSIS — Z6831 Body mass index (BMI) 31.0-31.9, adult: Secondary | ICD-10-CM | POA: Diagnosis not present

## 2022-11-05 DIAGNOSIS — G929 Unspecified toxic encephalopathy: Secondary | ICD-10-CM | POA: Diagnosis not present

## 2022-11-05 DIAGNOSIS — G47419 Narcolepsy without cataplexy: Secondary | ICD-10-CM | POA: Diagnosis not present

## 2022-11-05 DIAGNOSIS — F5104 Psychophysiologic insomnia: Secondary | ICD-10-CM | POA: Diagnosis not present

## 2023-08-22 DIAGNOSIS — D3132 Benign neoplasm of left choroid: Secondary | ICD-10-CM | POA: Diagnosis not present

## 2023-08-22 DIAGNOSIS — H524 Presbyopia: Secondary | ICD-10-CM | POA: Diagnosis not present

## 2023-08-22 DIAGNOSIS — H25813 Combined forms of age-related cataract, bilateral: Secondary | ICD-10-CM | POA: Diagnosis not present

## 2023-08-22 DIAGNOSIS — H5203 Hypermetropia, bilateral: Secondary | ICD-10-CM | POA: Diagnosis not present

## 2023-08-22 DIAGNOSIS — Z135 Encounter for screening for eye and ear disorders: Secondary | ICD-10-CM | POA: Diagnosis not present

## 2023-11-03 DIAGNOSIS — F5104 Psychophysiologic insomnia: Secondary | ICD-10-CM | POA: Diagnosis not present
